# Patient Record
Sex: Male | Born: 1960
Health system: Southern US, Community
[De-identification: ages and names within clinical notes are randomized; demographics above are authoritative.]

## PROBLEM LIST (undated history)

## (undated) DIAGNOSIS — IMO0002 Reserved for concepts with insufficient information to code with codable children: Secondary | ICD-10-CM

## (undated) DIAGNOSIS — I10 Essential (primary) hypertension: Secondary | ICD-10-CM

## (undated) DIAGNOSIS — T7840XA Allergy, unspecified, initial encounter: Secondary | ICD-10-CM

## (undated) DIAGNOSIS — M1990 Unspecified osteoarthritis, unspecified site: Secondary | ICD-10-CM

## (undated) DIAGNOSIS — K219 Gastro-esophageal reflux disease without esophagitis: Secondary | ICD-10-CM

## (undated) HISTORY — PX: OTHER SURGICAL HISTORY: SHX169

## (undated) HISTORY — PX: JOINT REPLACEMENT: SHX530

## (undated) HISTORY — PX: CHOLECYSTECTOMY: SHX55

## (undated) HISTORY — PX: SHOULDER SURGERY: SHX246

## (undated) HISTORY — DX: Reserved for concepts with insufficient information to code with codable children: IMO0002

## (undated) HISTORY — DX: Unspecified osteoarthritis, unspecified site: M19.90

## (undated) HISTORY — DX: Essential (primary) hypertension: I10

## (undated) HISTORY — PX: HAND SURGERY: SHX662

## (undated) HISTORY — DX: Gastro-esophageal reflux disease without esophagitis: K21.9

## (undated) HISTORY — DX: Allergy, unspecified, initial encounter: T78.40XA

---

## 1986-01-04 HISTORY — PX: ANKLE FRACTURE SURGERY: SHX122

## 2001-06-02 ENCOUNTER — Ambulatory Visit (HOSPITAL_COMMUNITY): Admission: RE | Admit: 2001-06-02 | Discharge: 2001-06-02 | Payer: Self-pay | Admitting: Family Medicine

## 2001-06-02 ENCOUNTER — Encounter: Payer: Self-pay | Admitting: Family Medicine

## 2002-10-30 ENCOUNTER — Ambulatory Visit (HOSPITAL_COMMUNITY): Admission: RE | Admit: 2002-10-30 | Discharge: 2002-10-30 | Payer: Self-pay | Admitting: Family Medicine

## 2002-10-31 ENCOUNTER — Ambulatory Visit (HOSPITAL_COMMUNITY): Admission: RE | Admit: 2002-10-31 | Discharge: 2002-10-31 | Payer: Self-pay | Admitting: Family Medicine

## 2006-05-19 ENCOUNTER — Ambulatory Visit (HOSPITAL_COMMUNITY): Admission: RE | Admit: 2006-05-19 | Discharge: 2006-05-19 | Payer: Self-pay | Admitting: Family Medicine

## 2006-07-11 ENCOUNTER — Encounter: Admission: RE | Admit: 2006-07-11 | Discharge: 2006-07-28 | Payer: Self-pay | Admitting: Neurosurgery

## 2007-03-10 ENCOUNTER — Ambulatory Visit (HOSPITAL_COMMUNITY): Admission: RE | Admit: 2007-03-10 | Discharge: 2007-03-10 | Payer: Self-pay | Admitting: Family Medicine

## 2009-04-10 ENCOUNTER — Encounter (INDEPENDENT_AMBULATORY_CARE_PROVIDER_SITE_OTHER): Payer: Self-pay | Admitting: Emergency Medicine

## 2009-04-10 ENCOUNTER — Ambulatory Visit: Payer: Self-pay | Admitting: Cardiology

## 2009-04-10 ENCOUNTER — Observation Stay (HOSPITAL_COMMUNITY): Admission: EM | Admit: 2009-04-10 | Discharge: 2009-04-10 | Payer: Self-pay | Admitting: Emergency Medicine

## 2010-03-25 LAB — BASIC METABOLIC PANEL
BUN: 16 mg/dL (ref 6–23)
CO2: 25 mEq/L (ref 19–32)
Calcium: 8.4 mg/dL (ref 8.4–10.5)
Chloride: 109 mEq/L (ref 96–112)
Creatinine, Ser: 1.17 mg/dL (ref 0.4–1.5)
GFR calc Af Amer: >60 mL/min (ref >60)
GFR calc non Af Amer: >60 mL/min (ref >60)
Glucose, Bld: 111 mg/dL — ABNORMAL HIGH (ref 70–99)
Potassium: 3.6 mEq/L (ref 3.5–5.1)
Sodium: 137 mEq/L (ref 135–145)

## 2010-03-25 LAB — POCT CARDIAC MARKERS
CKMB, poc: <1 ng/mL — ABNORMAL LOW (ref 1.0–8.0)
CKMB, poc: <1 ng/mL — ABNORMAL LOW (ref 1.0–8.0)
CKMB, poc: <1 ng/mL — ABNORMAL LOW (ref 1.0–8.0)
Myoglobin, poc: 105 ng/mL (ref 12–200)
Myoglobin, poc: 63.6 ng/mL (ref 12–200)
Myoglobin, poc: 95.1 ng/mL (ref 12–200)
Troponin i, poc: <0.05 ng/mL (ref 0.00–0.09)
Troponin i, poc: <0.05 ng/mL (ref 0.00–0.09)
Troponin i, poc: <0.05 ng/mL (ref 0.00–0.09)

## 2010-03-25 LAB — DIFFERENTIAL
Basophils Absolute: 0 10*3/uL (ref 0.0–0.1)
Basophils Relative: 0 % (ref 0–1)
Eosinophils Absolute: 0.3 10*3/uL (ref 0.0–0.7)
Eosinophils Relative: 3 % (ref 0–5)
Lymphocytes Relative: 13 % (ref 12–46)
Lymphs Abs: 1.3 10*3/uL (ref 0.7–4.0)
Monocytes Absolute: 0.8 10*3/uL (ref 0.1–1.0)
Monocytes Relative: 8 % (ref 3–12)
Neutro Abs: 7.6 10*3/uL (ref 1.7–7.7)
Neutrophils Relative %: 76 % (ref 43–77)

## 2010-03-25 LAB — CBC
HCT: 39.1 % (ref 39.0–52.0)
Hemoglobin: 13.2 g/dL (ref 13.0–17.0)
MCHC: 33.8 g/dL (ref 30.0–36.0)
MCV: 95.7 fL (ref 78.0–100.0)
Platelets: 232 10*3/uL (ref 150–400)
RBC: 4.09 MIL/uL — ABNORMAL LOW (ref 4.22–5.81)
RDW: 13.2 % (ref 11.5–15.5)
WBC: 10 10*3/uL (ref 4.0–10.5)

## 2011-09-28 ENCOUNTER — Encounter: Payer: Self-pay | Admitting: Gastroenterology

## 2011-11-10 ENCOUNTER — Other Ambulatory Visit: Payer: Self-pay | Admitting: Gastroenterology

## 2012-05-09 ENCOUNTER — Other Ambulatory Visit: Payer: Self-pay | Admitting: Nurse Practitioner

## 2012-05-09 ENCOUNTER — Other Ambulatory Visit: Payer: Self-pay | Admitting: *Deleted

## 2012-05-09 MED ORDER — EPINEPHRINE 0.3 MG/0.3ML IJ SOAJ: 0.3000 mg | Freq: Once | INTRAMUSCULAR | Status: DC

## 2013-02-06 ENCOUNTER — Encounter: Payer: Self-pay | Admitting: Family Medicine

## 2013-02-06 ENCOUNTER — Ambulatory Visit (INDEPENDENT_AMBULATORY_CARE_PROVIDER_SITE_OTHER): Payer: Federal, State, Local not specified - PPO | Admitting: Family Medicine

## 2013-02-06 ENCOUNTER — Encounter (INDEPENDENT_AMBULATORY_CARE_PROVIDER_SITE_OTHER): Payer: Self-pay

## 2013-02-06 VITALS — BP 152/91 | HR 86 | Temp 97.8°F | Ht 70.0 in | Wt 182.2 lb

## 2013-02-06 DIAGNOSIS — Z119 Encounter for screening for infectious and parasitic diseases, unspecified: Secondary | ICD-10-CM

## 2013-02-06 DIAGNOSIS — K219 Gastro-esophageal reflux disease without esophagitis: Secondary | ICD-10-CM | POA: Insufficient documentation

## 2013-02-06 DIAGNOSIS — Z860100 Personal history of colon polyps, unspecified: Secondary | ICD-10-CM

## 2013-02-06 DIAGNOSIS — J329 Chronic sinusitis, unspecified: Secondary | ICD-10-CM | POA: Insufficient documentation

## 2013-02-06 DIAGNOSIS — I10 Essential (primary) hypertension: Secondary | ICD-10-CM | POA: Insufficient documentation

## 2013-02-06 DIAGNOSIS — IMO0002 Reserved for concepts with insufficient information to code with codable children: Secondary | ICD-10-CM | POA: Insufficient documentation

## 2013-02-06 DIAGNOSIS — Z23 Encounter for immunization: Secondary | ICD-10-CM

## 2013-02-06 DIAGNOSIS — M1990 Unspecified osteoarthritis, unspecified site: Secondary | ICD-10-CM | POA: Insufficient documentation

## 2013-02-06 DIAGNOSIS — K8020 Calculus of gallbladder without cholecystitis without obstruction: Secondary | ICD-10-CM

## 2013-02-06 DIAGNOSIS — Z13220 Encounter for screening for lipoid disorders: Secondary | ICD-10-CM

## 2013-02-06 DIAGNOSIS — M129 Arthropathy, unspecified: Secondary | ICD-10-CM

## 2013-02-06 DIAGNOSIS — Z0000 Encounter for general adult medical examination without abnormal findings: Secondary | ICD-10-CM | POA: Insufficient documentation

## 2013-02-06 MED ORDER — MELOXICAM 15 MG PO TABS: 15.0000 mg | ORAL_TABLET | Freq: Every day | ORAL | Status: DC

## 2013-02-06 MED ORDER — AMOXICILLIN-POT CLAVULANATE 875-125 MG PO TABS: 1.0000 | ORAL_TABLET | Freq: Two times a day (BID) | ORAL | Status: DC

## 2013-02-06 MED ORDER — MOMETASONE FUROATE 50 MCG/ACT NA SUSP: 2.0000 | Freq: Every day | NASAL | Status: DC

## 2013-02-06 NOTE — Patient Instructions (Addendum)
HEALTH MAINTENANCE Immunizations: Tetanus-Diphtheria Booster ZOX:WRUEA Pertusis Booster due: today Flu Shot Due: every Fall, in 2 weeks. Pneumonia Vaccine: usually at 54 years of age unless there are certain risk situations. Herpes Zoster/Shingles Vaccine due: usually at 54 years of age HPV VWU:JWJX age 38 to 15 years in males and females.  Healthy Life Habits: Exercise Goal: 5-6 days/week; start gradually(ie 30 minutes/3days per week) Nutrition: Balanced healthy meals including Vegetables and Fruits. Consider  Reading the following books: 1) Eat to Live by Dr Ottis Stain; 2) Prevent and Reverse Heart Disease by Dr Suzzette Righter.  Vitamins:okay to take a multivitamin Aspirin:hold Stop Tobacco Use:n/a Seat Belt Use:+++ recommended Sunscreen Use:+++ recommended  Recommended Screening Tests: Colon Cancer Screening: will refer Blood work: today Cholesterol Screening:today            HIV:n/a                    Hepatitis C(people born 03-1964): today   Monthly Self Testicular Exam:+++  Eye Exam: every 1 to 2 years recommended Dental Health: at least every 6 months  Others:    Living Will/Healthcare Power of Attorney: should have this in order with your personal estate planning  DASH Diet The DASH diet stands for "Dietary Approaches to Stop Hypertension." It is a healthy eating plan that has been shown to reduce high blood pressure (hypertension) in as little as 14 days, while also possibly providing other significant health benefits. These other health benefits include reducing the risk of breast cancer after menopause and reducing the risk of type 2 diabetes, heart disease, colon cancer, and stroke. Health benefits also include weight loss and slowing kidney failure in patients with chronic kidney disease.  DIET GUIDELINES  Limit salt (sodium). Your diet should contain less than 1500 mg of sodium daily.  Limit refined or processed carbohydrates. Your diet should  include mostly whole grains. Desserts and added sugars should be used sparingly.  Include small amounts of heart-healthy fats. These types of fats include nuts, oils, and tub margarine. Limit saturated and trans fats. These fats have been shown to be harmful in the body. CHOOSING FOODS  The following food groups are based on a 2000 calorie diet. See your Registered Dietitian for individual calorie needs. Grains and Grain Products (6 to 8 servings daily)  Eat More Often: Whole-wheat bread, brown rice, whole-grain or wheat pasta, quinoa, popcorn without added fat or salt (air popped).  Eat Less Often: White bread, white pasta, white rice, cornbread. Vegetables (4 to 5 servings daily)  Eat More Often: Fresh, frozen, and canned vegetables. Vegetables may be raw, steamed, roasted, or grilled with a minimal amount of fat.  Eat Less Often/Avoid: Creamed or fried vegetables. Vegetables in a cheese sauce. Fruit (4 to 5 servings daily)  Eat More Often: All fresh, canned (in natural juice), or frozen fruits. Dried fruits without added sugar. One hundred percent fruit juice ( cup [237 mL] daily).  Eat Less Often: Dried fruits with added sugar. Canned fruit in light or heavy syrup. [employer], Fish, and Poultry (2 servings or less daily. One serving is 3 to 4 oz [85-114 g]).  Eat More Often: Ninety percent or leaner ground beef, tenderloin, sirloin. Round cuts of beef, chicken breast, [place] breast. All fish. Grill, bake, or broil your meat. Nothing should be fried.  Eat Less Often/Avoid: Fatty cuts of meat, [place], or chicken leg, thigh, or wing. Fried cuts of meat or fish. Dairy (2 to 3 servings)  Eat More Often: Low-fat or fat-free milk, low-fat plain or light yogurt, reduced-fat or part-skim cheese.  Eat Less Often/Avoid: Milk (whole, 2%).Whole milk yogurt. Full-fat cheeses. Nuts, Seeds, and Legumes (4 to 5 servings per week)  Eat More Often: All without added salt.  Eat Less Often/Avoid:  Salted nuts and seeds, canned beans with added salt. Fats and Sweets (limited)  Eat More Often: Vegetable oils, tub margarines without trans fats, sugar-free gelatin. Mayonnaise and salad dressings.  Eat Less Often/Avoid: Coconut oils, palm oils, butter, stick margarine, cream, half and half, cookies, candy, pie. FOR MORE INFORMATION The Dash Diet Eating Plan: www.dashdiet.org Document Released: 12/10/2010 Document Revised: 03/15/2011 Document Reviewed: 12/10/2010 [facility] Patient Information 2014 [place], [place]. Tetanus, Diphtheria, Pertussis (Tdap) Vaccine What You Need to Know WHY GET VACCINATED? Tetanus, diphtheria and pertussis can be very serious diseases, even for adolescents and adults. Tdap vaccine can protect [place] from these diseases. TETANUS (Lockjaw) causes painful muscle tightening and stiffness, usually all over the body.  It can lead to tightening of muscles in the head and neck so you can't open your mouth, swallow, or sometimes even breathe. Tetanus kills about 1 out of 5 people who are infected. DIPHTHERIA can cause a thick coating to form in the back of the throat.  It can lead to breathing problems, paralysis, heart failure, and death. PERTUSSIS (Whooping Cough) causes severe coughing spells, which can cause difficulty breathing, vomiting and disturbed sleep.  It can also lead to weight loss, incontinence, and rib fractures. Up to 2 in 100 adolescents and 5 in 100 adults with pertussis are hospitalized or have complications, which could include pneumonia and death. These diseases are caused by bacteria. Diphtheria and pertussis are spread from person to person through coughing or sneezing. Tetanus enters the body through cuts, scratches, or wounds. Before vaccines, the [place] saw as many as 200,000 cases a year of diphtheria and pertussis, and hundreds of cases of tetanus. Since vaccination began, tetanus and diphtheria have dropped by about 99% and pertussis by  about 80%. TDAP VACCINE Tdap vaccine can protect adolescents and adults from tetanus, diphtheria, and pertussis. One dose of Tdap is routinely given at age 22 or 83. People who did not get Tdap at that age should get it as soon as possible. Tdap is especially important for health care professionals and anyone having close contact with a baby younger than 12 months. Pregnant women should get a dose of Tdap during every pregnancy, to protect the newborn from pertussis. Infants are most at risk for severe, life-threatening complications from pertussis. A similar vaccine, called Td, protects from tetanus and diphtheria, but not pertussis. A Td booster should be given every 10 years. Tdap may be given as one of these boosters if you have not already gotten a dose. Tdap may also be given after a severe cut or burn to prevent tetanus infection. Your doctor can give you more information. Tdap may safely be given at the same time as other vaccines. SOME PEOPLE SHOULD NOT GET THIS VACCINE  If you ever had a life-threatening allergic reaction after a dose of any tetanus, diphtheria, or pertussis containing vaccine, OR if you have a severe allergy to any part of this vaccine, you should not get Tdap. Tell your doctor if you have any severe allergies.  If you had a coma, or long or multiple seizures within 7 days after a childhood dose of DTP or DTaP, you should not get Tdap, unless a cause other than  the vaccine was found. You can still get Td.  Talk to your doctor if you:  have epilepsy or another nervous system problem,  had severe pain or swelling after any vaccine containing diphtheria, tetanus or pertussis,  ever had Guillain-Barr Syndrome (GBS),  aren't feeling well on the day the shot is scheduled. RISKS OF A VACCINE REACTION With any medicine, including vaccines, there is a chance of side effects. These are usually mild and go away on their own, but serious reactions are also possible. Brief  fainting spells can follow a vaccination, leading to injuries from falling. Sitting or lying down for about 15 minutes can help prevent these. Tell your doctor if you feel dizzy or light-headed, or have vision changes or ringing in the ears. Mild problems following Tdap (Did not interfere with activities)  Pain where the shot was given (about 3 in 4 adolescents or 2 in 3 adults)  Redness or swelling where the shot was given (about 1 person in 5)  Mild fever of at least 100.21F (up to about 1 in 25 adolescents or 1 in 100 adults)  Headache (about 3 or 4 people in 10)  Tiredness (about 1 person in 3 or 4)  Nausea, vomiting, diarrhea, stomach ache (up to 1 in 4 adolescents or 1 in 10 adults)  Chills, body aches, sore joints, rash, swollen glands (uncommon) Moderate problems following Tdap (Interfered with activities, but did not require medical attention)  Pain where the shot was given (about 1 in 5 adolescents or 1 in 100 adults)  Redness or swelling where the shot was given (up to about 1 in 16 adolescents or 1 in 25 adults)  Fever over 102F (about 1 in 100 adolescents or 1 in 250 adults)  Headache (about 3 in 20 adolescents or 1 in 10 adults)  Nausea, vomiting, diarrhea, stomach ache (up to 1 or 3 people in 100)  Swelling of the entire arm where the shot was given (up to about 3 in 100). Severe problems following Tdap (Unable to perform usual activities, required medical attention)  Swelling, severe pain, bleeding and redness in the arm where the shot was given (rare). A severe allergic reaction could occur after any vaccine (estimated less than 1 in a million doses). WHAT IF THERE IS A SERIOUS REACTION? What should I look for?  Look for anything that concerns you, such as signs of a severe allergic reaction, very high fever, or behavior changes. Signs of a severe allergic reaction can include hives, swelling of the face and throat, difficulty breathing, a fast heartbeat,  dizziness, and weakness. These would start a few minutes to a few hours after the vaccination. What should I do?  If you think it is a severe allergic reaction or other emergency that can't wait, call 9-1-1 or get the person to the nearest hospital. Otherwise, call your doctor.  Afterward, the reaction should be reported to the "Vaccine Adverse Event Reporting System" (VAERS). Your doctor might file this report, or you can do it yourself through the VAERS web site at www.vaers.LAgents.no, or by calling 1-(339)199-3511. VAERS is only for reporting reactions. They do not give medical advice.  THE NATIONAL VACCINE INJURY COMPENSATION PROGRAM The National Vaccine Injury Compensation Program (VICP) is a federal program that was created to compensate people who may have been injured by certain vaccines. Persons who believe they may have been injured by a vaccine can learn about the program and about filing a claim by calling 1-850-146-5384 or visiting  the VICP website at SpiritualWord.atwww.hrsa.gov/vaccinecompensation. HOW CAN I LEARN MORE?  Ask your doctor.  Call your local or state health department.  Contact the Centers for Disease Control and Prevention (CDC):  Call 320-336-22761-601-590-9849 or visit CDC's website at PicCapture.uywww.cdc.gov/vaccines. CDC Tdap Vaccine VIS (05/13/11) Document Released: 06/22/2011 Document Revised: 04/17/2012 Document Reviewed: 04/12/2012 [facility] Patient Information 2014 [place].

## 2013-02-06 NOTE — Progress Notes (Signed)
Patient ID: Scott James, male   DOB: [birth year], 53 y.o.   MRN: [id number] SUBJECTIVE: CC: Chief Complaint  Patient presents with  . Annual Exam    [facility] for chest pain states had complete workiup yest states has gallstones and will need surgeon  . Sinusitis    thinks has sinus infection     HPI: Here for annual physical. Last night had chest pain and abdominal pain. Evaluated in [place]: found to have Gallstones. Had stress test several years ago: was fine. Has had elevated BP in the past. Lisinopril 5 mg and his BP bottomed out and had to come off of it. needs a colonoscopy for screening. No pain now.   Past Medical History  Diagnosis Date  . Bulging disc     L4-5  . Hypertension   . GERD (gastroesophageal reflux disease)   . Arthritis     rt ankle   Past Surgical History  Procedure Laterality Date  . Tonsillectomy    . Joint replacement      rt knee arthroscopy  . Ankle fracture surgery Right     shatterred talleous    History   Social History  . Marital Status: Married    Spouse Name: N/A    Number of Children: N/A  . Years of Education: N/A   Occupational History  . Not on file.   Social History Main Topics  . Smoking status: Former Smoker    Types: Cigarettes    Quit date: 02/07/2007  . Smokeless tobacco: Not on file  . Alcohol Use: Not on file  . Drug Use: Not on file  . Sexual Activity: Not on file   Other Topics Concern  . Not on file   Social History Narrative  . No narrative on file   Family History  Problem Relation Age of Onset  . Hypertension Mother    Current Outpatient Prescriptions on File Prior to Visit  Medication Sig Dispense Refill  . EPINEPHrine (EPIPEN) 0.3 mg/0.3 mL DEVI Inject 0.3 mLs (0.3 mg total) into the muscle once.  2 Device  1   No current facility-administered medications on file prior to visit.   No Known Allergies Immunization History  Administered Date(s) Administered  . Tdap 02/06/2013    Prior to Admission medications   Medication Sig Start Date End Date Taking? Authorizing Provider  aspirin 81 MG tablet Take 81 mg by mouth daily.   Yes Historical Provider, MD  EPINEPHrine (EPIPEN) 0.3 mg/0.3 mL DEVI Inject 0.3 mLs (0.3 mg total) into the muscle once. 05/09/12  Yes Mary-Margaret Daphine Deutscher, FNP  pantoprazole (PROTONIX) 40 MG tablet Take 40 mg by mouth daily.   Yes Historical Provider, MD     ROS: As above in the HPI. All other systems are stable or negative.  OBJECTIVE: APPEARANCE:  Patient in no acute distress.The patient appeared well nourished and normally developed. Acyanotic. Waist: VITAL SIGNS:BP 152/91  Pulse 86  Temp(Src) 97.8 F (36.6 C) (Oral)  Ht 5\' 10"  (1.778 m)  Wt 182 lb 3.2 oz (82.645 kg)  BMI 26.14 kg/m2 WM  Recheck 130/85 LA  SKIN: warm and  Dry without overt rashes, tattoos and scars  HEAD and Neck: without JVD, Head and scalp: normal Eyes:No scleral icterus. Fundi normal, eye movements normal. Ears: Auricle normal, canal normal, Tympanic membranes normal, insufflation normal. Nose: normal Throat: normal Neck & thyroid: normal  CHEST & LUNGS: Chest wall: normal Lungs: Clear  CVS: Reveals the PMI to be  normally located. Regular rhythm, First and Second Heart sounds are normal,  absence of murmurs, rubs or gallops. Peripheral vasculature: Radial pulses: normal Dorsal pedis pulses: normal Posterior pulses: normal  ABDOMEN:  Appearance: normal Benign, no organomegaly, no masses, no Abdominal Aortic enlargement. No Guarding , no rebound. No Bruits. Bowel sounds: normal  RECTAL: N/A GU: N/A  EXTREMETIES: nonedematous.  MUSCULOSKELETAL:  Spine: normal Joints: intact  NEUROLOGIC: oriented to time,place and person; nonfocal. Strength is normal Sensory is normal Reflexes are normal Cranial Nerves are normal.  ASSESSMENT: Annual physical exam  GERD (gastroesophageal reflux disease)  Bulging  disc  Hypertension  Arthritis  Sinusitis nasal - Plan: amoxicillin-clavulanate (AUGMENTIN) 875-125 MG per tablet, mometasone (NASONEX) 50 MCG/ACT nasal spray, meloxicam (MOBIC) 15 MG tablet  Gallstones - Plan: Ambulatory referral to General Surgery, Amylase, Hepatic function panel, Lipase  Screening cholesterol level - Plan: Lipid panel  Screening examination for infectious disease - Plan: Hepatitis C antibody  Personal history of colonic polyps - Plan: Ambulatory referral to Gastroenterology  Need for Tdap vaccination - Plan: Tdap vaccine greater than or equal to 7yo IM  PLAN:      HEALTH MAINTENANCE Immunizations: Tetanus-Diphtheria Booster ZOX:WRUEAdue:today Pertusis Booster due: today Flu Shot Due: every Fall, in 2 weeks. Pneumonia Vaccine: usually at 54 years of age unless there are certain risk situations. Herpes Zoster/Shingles Vaccine due: usually at 53 years of age HPV VWU:JWJXdue:from age 49 to 3426 years in males and females.  Healthy Life Habits: Exercise Goal: 5-6 days/week; start gradually(ie 30 minutes/3days per week) Nutrition: Balanced healthy meals including Vegetables and Fruits. Consider  Reading the following books: 1) Eat to Live by Dr Ottis StainJoel Furhman; 2) Prevent and Reverse Heart Disease by Dr Suzzette Righteraldwell Esselstyn.  Vitamins:okay to take a multivitamin Aspirin:hold Stop Tobacco Use:n/a Seat Belt Use:+++ recommended Sunscreen Use:+++ recommended  Recommended Screening Tests: Colon Cancer Screening: will refer Blood work: today Cholesterol Screening:today            HIV:n/a                    Hepatitis C(people born 441945-1966): today   Monthly Self Testicular Exam:+++  Eye Exam: every 1 to 2 years recommended Dental Health: at least every 6 months  Others:    Living Will/Healthcare Power of Attorney: should have this in order with your personal estate planning   Handout on DASH Diet in the AVS.  Orders Placed This Encounter  Procedures  . Tdap vaccine greater  than or equal to 7yo IM  . Hepatitis C antibody  . Lipid panel  . Amylase  . Hepatic function panel  . Lipase  . Ambulatory referral to General Surgery    Referral Priority:  Urgent    Referral Type:  Surgical    Referral Reason:  Specialty Services Required    Requested Specialty:  General Surgery    Number of Visits Requested:  1  . Ambulatory referral to Gastroenterology    Referral Priority:  Routine    Referral Type:  Consultation    Referral Reason:  Specialty Services Required    Requested Specialty:  Gastroenterology    Number of Visits Requested:  1   Meds ordered this encounter  Medications  . aspirin 81 MG tablet    Sig: Take 81 mg by mouth daily.  . pantoprazole (PROTONIX) 40 MG tablet    Sig: Take 40 mg by mouth daily.  Marland Kitchen. amoxicillin-clavulanate (AUGMENTIN) 875-125 MG per tablet    Sig: Take  1 tablet by mouth 2 (two) times daily.    Dispense:  20 tablet    Refill:  0  . mometasone (NASONEX) 50 MCG/ACT nasal spray    Sig: Place 2 sprays into the nose daily.    Dispense:  17 g    Refill:  5  . meloxicam (MOBIC) 15 MG tablet    Sig: Take 1 tablet (15 mg total) by mouth daily.    Dispense:  30 tablet    Refill:  2   There are no discontinued medications. Return in about 2 months (around 04/06/2013) for Recheck medical problems.  Devontay Celaya P. [place] Charon, M.D.

## 2013-02-07 LAB — LIPID PANEL
Chol/HDL Ratio: 3.5 ratio units (ref 0.0–5.0)
Cholesterol, Total: 178 mg/dL (ref 100–199)
HDL: 51 mg/dL (ref >39)
LDL Calculated: 99 mg/dL (ref 0–99)
Triglycerides: 139 mg/dL (ref 0–149)
VLDL Cholesterol Cal: 28 mg/dL (ref 5–40)

## 2013-02-07 LAB — HEPATIC FUNCTION PANEL
ALT: 223 IU/L — ABNORMAL HIGH (ref 0–44)
AST: 94 IU/L — ABNORMAL HIGH (ref 0–40)
Albumin: 4.6 g/dL (ref 3.5–5.5)
Alkaline Phosphatase: 141 IU/L — ABNORMAL HIGH (ref 39–117)
Bilirubin, Direct: 0.15 mg/dL (ref 0.00–0.40)
Total Bilirubin: 0.5 mg/dL (ref 0.0–1.2)
Total Protein: 6.9 g/dL (ref 6.0–8.5)

## 2013-02-07 LAB — HEPATITIS C ANTIBODY: Hep C Virus Ab: <0.1 s/co ratio (ref 0.0–0.9)

## 2013-02-07 LAB — AMYLASE: Amylase: 64 U/L (ref 31–124)

## 2013-02-07 LAB — LIPASE: Lipase: 29 U/L (ref 0–59)

## 2013-02-07 NOTE — Progress Notes (Signed)
Quick Note:  Call Patient Labs that are abnormal: Liver enzymes still a bit elevated.  The rest are at goal  Recommendations: No changes. See the surgeon in regards to the gallstones.   ______

## 2013-02-08 ENCOUNTER — Telehealth: Payer: Self-pay | Admitting: *Deleted

## 2013-02-08 NOTE — Telephone Encounter (Signed)
Message copied by Almeta MonasSTONE, Gloriana Piltz M on Thu Feb 08, 2013  3:22 PM ------      Message from: Ileana LaddWONG, FRANCIS P      Created: Wed Feb 07, 2013  8:33 PM       Call Patient      Labs that are abnormal:      Liver enzymes still a bit elevated.            The rest are at goal            Recommendations:      No changes.      See the surgeon in regards to the gallstones.             ------

## 2013-02-13 ENCOUNTER — Encounter (INDEPENDENT_AMBULATORY_CARE_PROVIDER_SITE_OTHER): Payer: Self-pay | Admitting: Surgery

## 2013-02-13 ENCOUNTER — Ambulatory Visit (INDEPENDENT_AMBULATORY_CARE_PROVIDER_SITE_OTHER): Payer: Federal, State, Local not specified - PPO | Admitting: Surgery

## 2013-02-13 VITALS — BP 124/83 | HR 77 | Temp 98.6°F | Resp 18 | Ht 70.5 in | Wt 186.6 lb

## 2013-02-13 DIAGNOSIS — K8010 Calculus of gallbladder with chronic cholecystitis without obstruction: Secondary | ICD-10-CM

## 2013-02-13 NOTE — Progress Notes (Signed)
Patient ID: Scott James, male   DOB: [birth year], 53 y.o.   MRN: [id number]  Chief Complaint  Patient presents with  . Abdominal Pain    gallstones    HPI Scott James is a 53 y.o. male.  Referred by Dr. Modesto Charon for evaluation of gallbladder disease  Abdominal Pain Associated symptoms: nausea   Associated symptoms: no chest pain, no chills, no constipation, no cough, no diarrhea, no fever, no hematuria, no sore throat and no vomiting    This is a 53 year old male that presents with a three-week history of intermittent epigastric pain associated with some mild nausea and abdominal bloating. He has had 3 severe episodes. He was ruled out for cardiac disease. Ultrasound showed multiple gallstones but no sign of cholecystitis. His liver function tests have been mildly abnormal but bilirubin was normal.  He presents now for surgical evaluation Past Medical History  Diagnosis Date  . Bulging disc     L4-5  . Hypertension   . GERD (gastroesophageal reflux disease)   . Arthritis     rt ankle    Past Surgical History  Procedure Laterality Date  . Tonsillectomy    . Joint replacement      rt knee arthroscopy  . Ankle fracture surgery Right     shatterred talleous   . Hand surgery      Family History  Problem Relation Age of Onset  . Hypertension Mother     Social History History  Substance Use Topics  . Smoking status: Former Smoker    Types: Cigarettes    Quit date: 02/07/2007  . Smokeless tobacco: Not on file  . Alcohol Use: Yes     Comment: rarely    No Known Allergies  Current Outpatient Prescriptions  Medication Sig Dispense Refill  . amoxicillin-clavulanate (AUGMENTIN) 875-125 MG per tablet Take 1 tablet by mouth 2 (two) times daily.  20 tablet  0  . aspirin 81 MG tablet Take 81 mg by mouth daily.      Marland Kitchen EPINEPHrine (EPIPEN) 0.3 mg/0.3 mL DEVI Inject 0.3 mLs (0.3 mg total) into the muscle once.  2 Device  1  . meloxicam (MOBIC) 15 MG tablet Take 1 tablet (15 mg  total) by mouth daily.  30 tablet  2  . mometasone (NASONEX) 50 MCG/ACT nasal spray Place 2 sprays into the nose daily.  17 g  5  . pantoprazole (PROTONIX) 40 MG tablet Take 40 mg by mouth daily.       No current facility-administered medications for this visit.    Review of Systems Review of Systems  Constitutional: Negative for fever, chills and unexpected weight change.  HENT: Negative for congestion, hearing loss, sore throat, trouble swallowing and voice change.   Eyes: Negative for visual disturbance.  Respiratory: Negative for cough and wheezing.   Cardiovascular: Negative for chest pain, palpitations and leg swelling.  Gastrointestinal: Positive for nausea, abdominal pain and abdominal distention. Negative for vomiting, diarrhea, constipation, blood in stool, anal bleeding and rectal pain.  Genitourinary: Negative for hematuria and difficulty urinating.  Musculoskeletal: Negative for arthralgias.  Skin: Negative for rash and wound.  Neurological: Negative for seizures, syncope, weakness and headaches.  Hematological: Negative for adenopathy. Does not bruise/bleed easily.  Psychiatric/Behavioral: Negative for confusion.    Blood pressure 124/83, pulse 77, temperature 98.6 F (37 C), temperature source Temporal, resp. rate 18, height 5' 10.5" (1.791 m), weight 186 lb 9.6 oz (84.641 kg).  Physical Exam Physical Exam WDWN in NAD HEENT:  EOMI, sclera anicteric Neck:  No masses, no thyromegaly Lungs:  CTA bilaterally; normal respiratory effort CV:  Regular rate and rhythm; no murmurs Abd:  +bowel sounds, soft, non-tender, no masses Ext:  Well-perfused; no edema Skin:  Warm, dry; no sign of jaundice  Data Reviewed  Lab Results  Component Value Date   ALT 223* 02/06/2013   AST 94* 02/06/2013   ALKPHOS 141* 02/06/2013   BILITOT 0.5 02/06/2013   [place] chole[place]ithiasis but no sign of cholecystitis  Assessment    Chronic calculus cholecystitis     Plan     Laparoscopic cholecystectomy with intraoperative cholangiogram. The surgical procedure has been discussed with the patient.  Potential risks, benefits, alternative treatments, and expected outcomes have been explained.  All of the patient's questions at this time have been answered.  The likelihood of reaching the patient's treatment goal is good.  The patient understand the proposed surgical procedure and wishes to proceed.         Kion Huntsberry K. 02/13/2013, 4:06 PM

## 2013-02-16 ENCOUNTER — Encounter: Payer: Self-pay | Admitting: Gastroenterology

## 2013-02-27 ENCOUNTER — Ambulatory Visit (INDEPENDENT_AMBULATORY_CARE_PROVIDER_SITE_OTHER): Payer: Federal, State, Local not specified - PPO | Admitting: General Surgery

## 2013-03-06 ENCOUNTER — Other Ambulatory Visit (INDEPENDENT_AMBULATORY_CARE_PROVIDER_SITE_OTHER): Payer: Self-pay | Admitting: *Deleted

## 2013-03-06 ENCOUNTER — Other Ambulatory Visit (INDEPENDENT_AMBULATORY_CARE_PROVIDER_SITE_OTHER): Payer: Self-pay | Admitting: Surgery

## 2013-03-06 DIAGNOSIS — K8010 Calculus of gallbladder with chronic cholecystitis without obstruction: Secondary | ICD-10-CM

## 2013-03-06 MED ORDER — OXYCODONE-ACETAMINOPHEN 5-325 MG PO TABS: 1.0000 | ORAL_TABLET | ORAL | Status: DC | PRN

## 2013-03-09 ENCOUNTER — Encounter: Payer: Self-pay | Admitting: Gastroenterology

## 2013-03-30 ENCOUNTER — Encounter (INDEPENDENT_AMBULATORY_CARE_PROVIDER_SITE_OTHER): Payer: Self-pay | Admitting: Surgery

## 2013-03-30 ENCOUNTER — Ambulatory Visit (INDEPENDENT_AMBULATORY_CARE_PROVIDER_SITE_OTHER): Payer: Federal, State, Local not specified - PPO | Admitting: Surgery

## 2013-03-30 VITALS — BP 148/96 | HR 80 | Temp 97.6°F | Resp 14 | Ht 70.0 in | Wt 183.8 lb

## 2013-03-30 DIAGNOSIS — K8010 Calculus of gallbladder with chronic cholecystitis without obstruction: Secondary | ICD-10-CM

## 2013-03-30 NOTE — Progress Notes (Signed)
Status post laparoscopic cholecystectomy with cholangiogram on 03/06/13. He still well. No issues with his bowel movements except for some mild constipation. His incisions are well healed. He still has some soreness around his umbilicus. No sign of hernia. Appetite is good. He may resume full activity. Followup as needed. He may have a colonoscopy as indicated.  Wilmon ArmsMatthew K. Corliss Skainssuei, MD, [facility]  General/ Trauma Surgery  03/30/2013 9:42 AM

## 2013-05-25 ENCOUNTER — Other Ambulatory Visit: Payer: Self-pay | Admitting: Nurse Practitioner

## 2013-07-30 ENCOUNTER — Encounter: Payer: Self-pay | Admitting: Family Medicine

## 2013-07-30 ENCOUNTER — Telehealth: Payer: Self-pay | Admitting: Family Medicine

## 2013-07-30 ENCOUNTER — Ambulatory Visit (INDEPENDENT_AMBULATORY_CARE_PROVIDER_SITE_OTHER): Payer: Federal, State, Local not specified - PPO | Admitting: Family Medicine

## 2013-07-30 VITALS — BP 155/94 | HR 77 | Temp 97.9°F | Ht 70.0 in | Wt 186.0 lb

## 2013-07-30 DIAGNOSIS — L989 Disorder of the skin and subcutaneous tissue, unspecified: Secondary | ICD-10-CM

## 2013-07-30 DIAGNOSIS — G44221 Chronic tension-type headache, intractable: Secondary | ICD-10-CM

## 2013-07-30 DIAGNOSIS — G44229 Chronic tension-type headache, not intractable: Secondary | ICD-10-CM

## 2013-07-30 MED ORDER — CYCLOBENZAPRINE HCL 10 MG PO TABS: 10.0000 mg | ORAL_TABLET | Freq: Three times a day (TID) | ORAL | Status: DC | PRN

## 2013-07-30 NOTE — Telephone Encounter (Signed)
Appt given today per patients request 

## 2013-07-30 NOTE — Progress Notes (Signed)
   Subjective:    Patient ID: Dina RichStephen Keeven, male    DOB: 04/11/1960, 53 y.o.   MRN: [id number]  HPI This 53 y.o. male presents for evaluation of chronic headaches and skin lesion on right forehead.   Review of Systems No chest pain, SOB, HA, dizziness, vision change, N/V, diarrhea, constipation, dysuria, urinary urgency or frequency, myalgias, arthralgias or rash.     Objective:   Physical Exam Vital signs noted  Well developed well nourished male.  HEENT - Head atraumatic Normocephalic                Eyes - PERRLA, Conjuctiva - clear Sclera- Clear EOMI                Ears - EAC's Wnl TM's Wnl Gross Hearing WNL                Throat - oropharanx wnl Respiratory - Lungs CTA bilateral Cardiac - RRR S1 and S2 without murmur GI - Abdomen soft Nontender and bowel sounds active x 4 Skin - Oval raised skin lesion over right temporal area       Assessment & Plan:  Chronic tension-type headache, intractable - Plan: cyclobenzaprine (FLEXERIL) 10 MG tablet  Skin lesion - Plan: Ambulatory referral to Dermatology  Deatra CanterWilliam J Oxford FNP

## 2013-09-25 ENCOUNTER — Ambulatory Visit (INDEPENDENT_AMBULATORY_CARE_PROVIDER_SITE_OTHER): Payer: Federal, State, Local not specified - PPO | Admitting: Family Medicine

## 2013-09-25 ENCOUNTER — Encounter: Payer: Self-pay | Admitting: Family Medicine

## 2013-09-25 VITALS — BP 146/88 | HR 79 | Temp 98.2°F | Ht 70.0 in | Wt 186.0 lb

## 2013-09-25 DIAGNOSIS — W57XXXA Bitten or stung by nonvenomous insect and other nonvenomous arthropods, initial encounter: Secondary | ICD-10-CM

## 2013-09-25 DIAGNOSIS — T148 Other injury of unspecified body region: Secondary | ICD-10-CM

## 2013-09-25 MED ORDER — DOXYCYCLINE HYCLATE 100 MG PO TABS: 100.0000 mg | ORAL_TABLET | Freq: Two times a day (BID) | ORAL | Status: DC

## 2013-09-25 NOTE — Progress Notes (Signed)
   Subjective:    Patient ID: Scott James, male    DOB: 1960-11-20, 53 y.o.   MRN: [id number]  HPI This 54 y.o. male presents for evaluation of rash on left leg after being bitten by a tick.   Review of Systems No chest pain, SOB, HA, dizziness, vision change, N/V, diarrhea, constipation, dysuria, urinary urgency or frequency, myalgias, arthralgias or rash.     Objective:   Physical Exam Vital signs noted  Well developed well nourished male.  HEENT - Head atraumatic Normocephalic Respiratory - Lungs CTA bilateral Cardiac - RRR S1 and S2 without murmur Skin - rash on left leg       Assessment & Plan:  Tick bite - Plan: Lyme Ab/Western Blot Reflex, Rocky mtn spotted fvr abs pnl(IgG+IgM) Doxycycline  one po bid x 10 days #20 Deatra Canter FNP

## 2013-09-27 LAB — RMSF, IGG, IFA: RMSF, IGG, IFA: 1:64 {titer} — ABNORMAL HIGH

## 2013-09-27 LAB — ROCKY MTN SPOTTED FVR ABS PNL(IGG+IGM)
RMSF IgG: UNDETERMINED
RMSF IgM: 0.17 index (ref 0.00–0.89)

## 2013-09-27 LAB — LYME AB/WESTERN BLOT REFLEX
LYME DISEASE AB, QUANT, IGM: <0.8 index (ref 0.00–0.79)
Lyme IgG/IgM Ab: <0.91 {ISR} (ref 0.00–0.90)

## 2013-09-28 ENCOUNTER — Telehealth: Payer: Self-pay | Admitting: *Deleted

## 2013-09-28 NOTE — Telephone Encounter (Signed)
Message copied by Almeta Monas on Fri Sep 28, 2013  2:06 PM ------      Message from: Deatra Canter      Created: Fri Sep 28, 2013  8:47 AM       lymes titre is negative and RMSF is equivocal and doxycycline will tx any equivocal or positive RMSF titre.  Continue doxycycline ------

## 2013-09-28 NOTE — Telephone Encounter (Signed)
Aware. 

## 2013-10-09 ENCOUNTER — Telehealth: Payer: Self-pay | Admitting: *Deleted

## 2013-10-09 MED ORDER — DOXYCYCLINE HYCLATE 100 MG PO TABS: 100.0000 mg | ORAL_TABLET | Freq: Two times a day (BID) | ORAL | Status: DC

## 2013-10-09 NOTE — Telephone Encounter (Signed)
[facility] called regarding positive RMSF titer. Patient was treated with course of doxycycline for 10 days. He saw Ander SladeBill Oxford, FNP. Recommended is 14 days.  Since he finished it about a week ago should we restart the full 14 day course instead of adding 4 additional days?

## 2013-10-09 NOTE — Telephone Encounter (Signed)
Medication sent to pharmacy.  Patient aware.  

## 2013-10-09 NOTE — Telephone Encounter (Signed)
Retreat this patient with doxycycline 100 mg twice daily for 2 weeks

## 2013-10-09 NOTE — Telephone Encounter (Signed)
Pt notified of results and recommendation Will complete 2 wk course of Doxycycline Will return to clinic if needed

## 2013-10-24 ENCOUNTER — Telehealth: Payer: Self-pay | Admitting: Family Medicine

## 2013-10-24 NOTE — Telephone Encounter (Signed)
lmtcb -kc 10/21

## 2013-10-24 NOTE — Telephone Encounter (Signed)
Appt given for tomorrow per patient request 

## 2013-10-25 ENCOUNTER — Ambulatory Visit (INDEPENDENT_AMBULATORY_CARE_PROVIDER_SITE_OTHER): Payer: Federal, State, Local not specified - PPO | Admitting: Nurse Practitioner

## 2013-10-25 ENCOUNTER — Encounter: Payer: Self-pay | Admitting: Nurse Practitioner

## 2013-10-25 ENCOUNTER — Ambulatory Visit (INDEPENDENT_AMBULATORY_CARE_PROVIDER_SITE_OTHER): Payer: Federal, State, Local not specified - PPO

## 2013-10-25 VITALS — BP 159/96 | HR 90 | Temp 97.5°F | Ht 70.0 in | Wt 184.0 lb

## 2013-10-25 DIAGNOSIS — M25561 Pain in right knee: Secondary | ICD-10-CM

## 2013-10-25 MED ORDER — PREDNISONE 20 MG PO TABS: ORAL_TABLET | ORAL | Status: DC

## 2013-10-25 MED ORDER — METHYLPREDNISOLONE ACETATE 80 MG/ML IJ SUSP
80.0000 mg | Freq: Once | INTRAMUSCULAR | Status: AC
  Administered 2013-10-25: 80 mg via INTRAMUSCULAR

## 2013-10-25 NOTE — Progress Notes (Signed)
   Subjective:    Patient ID: Scott James, male    DOB: 03/14/1960, 53 y.o.   MRN: [id number]  HPI Patient was walking at work and felt a pain in his right knee- This happened Tuesday and since then it is swollen and hurts to walk on.    Review of Systems  All other systems reviewed and are negative.      Objective:   Physical Exam  Constitutional: He is oriented to person, place, and time. He appears well-developed and well-nourished.  Cardiovascular: Normal rate and normal heart sounds.   Pulmonary/Chest: Effort normal and breath sounds normal.  Musculoskeletal:  Right knee effusion- pain on full extension Crepitus on flex and ext. All ligaments intact.  Neurological: He is alert and oriented to person, place, and time.  Skin: Skin is warm and dry.  Psychiatric: He has a normal mood and affect. His behavior is normal. Judgment and thought content normal.   BP 159/96  Pulse 90  Temp(Src) 97.5 F (36.4 C) (Oral)  Ht 5\' 10"  (1.778 m)  Wt 184 lb (83.462 kg)  BMI 26.40 kg/m2  Right knee xary- no acute findings-Preliminary reading by Paulene FloorMary Jaquay Morneault, FNP  [facility]       Assessment & Plan:   1. Right knee pain    Meds ordered this encounter  Medications  . methylPREDNISolone acetate (DEPO-MEDROL) injection 80 mg    Sig:   . predniSONE (DELTASONE) 20 MG tablet    Sig: 2 Tablets PO at the same time daily for 5 days- do not start until tomorrow    Dispense:  10 tablet    Refill:  0    Order Specific Question:  Supervising Provider    Answer:  Ernestina PennaMOORE, DONALD W [[id number]]   Rest Ice Elevate when sitting  Mary-Margaret Daphine DeutscherMartin, FNP

## 2013-10-25 NOTE — Patient Instructions (Signed)

## 2013-11-02 ENCOUNTER — Telehealth: Payer: Self-pay | Admitting: Nurse Practitioner

## 2013-11-02 DIAGNOSIS — M25561 Pain in right knee: Secondary | ICD-10-CM

## 2013-11-02 NOTE — Telephone Encounter (Signed)
Need ortho referral- who do want to see

## 2013-11-02 NOTE — Telephone Encounter (Signed)
Pt states the steroids took the swelling and fluid out of his knee. He's still c/o pain in his knee while walking and if he sits in one place for too long. He has been taking tylenol twice a day to try and alleviate the discomfort. Pt wants to know what else he can do? Please advise

## 2013-11-03 NOTE — Telephone Encounter (Signed)
[facility] and referral was ordered.

## 2013-11-23 ENCOUNTER — Ambulatory Visit (INDEPENDENT_AMBULATORY_CARE_PROVIDER_SITE_OTHER): Payer: Federal, State, Local not specified - PPO | Admitting: Family Medicine

## 2013-11-23 ENCOUNTER — Telehealth: Payer: Self-pay | Admitting: Nurse Practitioner

## 2013-11-23 VITALS — BP 145/93 | HR 96 | Temp 97.7°F | Wt 181.8 lb

## 2013-11-23 DIAGNOSIS — J069 Acute upper respiratory infection, unspecified: Secondary | ICD-10-CM

## 2013-11-23 MED ORDER — AZITHROMYCIN 250 MG PO TABS: ORAL_TABLET | ORAL | Status: DC

## 2013-11-23 MED ORDER — METHYLPREDNISOLONE ACETATE 80 MG/ML IJ SUSP
80.0000 mg | Freq: Once | INTRAMUSCULAR | Status: AC
  Administered 2013-11-23: 80 mg via INTRAMUSCULAR

## 2013-11-23 MED ORDER — BENZONATATE 100 MG PO CAPS: 100.0000 mg | ORAL_CAPSULE | Freq: Three times a day (TID) | ORAL | Status: DC | PRN

## 2013-11-23 NOTE — Progress Notes (Signed)
   Subjective:    Patient ID: Scott RichStephen Albee, male    DOB: 04/08/1960, 53 y.o.   MRN: [id number]  HPI C/o uri sx's for 3 days  Review of Systems  Constitutional: Negative for fever.  HENT: Negative for ear pain.   Eyes: Negative for discharge.  Respiratory: Negative for cough.   Cardiovascular: Negative for chest pain.  Gastrointestinal: Negative for abdominal distention.  Endocrine: Negative for polyuria.  Genitourinary: Negative for difficulty urinating.  Musculoskeletal: Negative for gait problem and neck pain.  Skin: Negative for color change and rash.  Neurological: Negative for speech difficulty and headaches.  Psychiatric/Behavioral: Negative for agitation.       Objective:    BP 145/93 mmHg  Pulse 96  Temp(Src) 97.7 F (36.5 C) (Oral)  Wt 181 lb 12.8 oz (82.464 kg) Physical Exam  Constitutional: He is oriented to person, place, and time. He appears well-developed and well-nourished.  HENT:  Head: Normocephalic and atraumatic.  Mouth/Throat: Oropharynx is clear and moist.  Eyes: Pupils are equal, round, and reactive to light.  Neck: Normal range of motion. Neck supple.  Cardiovascular: Normal rate and regular rhythm.   No murmur heard. Pulmonary/Chest: Effort normal and breath sounds normal.  Abdominal: Soft. Bowel sounds are normal. There is no tenderness.  Neurological: He is alert and oriented to person, place, and time.  Skin: Skin is warm and dry.  Psychiatric: He has a normal mood and affect.          Assessment & Plan:     ICD-9-CM ICD-10-CM   1. URI (upper respiratory infection) 465.9 J06.9 azithromycin (ZITHROMAX) 250 MG tablet     methylPREDNISolone acetate (DEPO-MEDROL) injection 80 mg     benzonatate (TESSALON PERLES) 100 MG capsule   Push po fluids, rest, tylenol and motrin otc prn as directed for fever, arthralgias, and myalgias.  Follow up prn if sx's continue or persist.  No Follow-up on file.  Deatra CanterWilliam J Oxford FNP

## 2013-11-23 NOTE — Telephone Encounter (Signed)
Appointment given for 4:45 with Annette StableBill

## 2014-01-18 ENCOUNTER — Ambulatory Visit (INDEPENDENT_AMBULATORY_CARE_PROVIDER_SITE_OTHER): Payer: Federal, State, Local not specified - PPO | Admitting: Family Medicine

## 2014-01-18 ENCOUNTER — Encounter: Payer: Self-pay | Admitting: Family Medicine

## 2014-01-18 VITALS — BP 153/95 | HR 101 | Temp 97.3°F | Ht 70.0 in | Wt 184.0 lb

## 2014-01-18 DIAGNOSIS — J206 Acute bronchitis due to rhinovirus: Secondary | ICD-10-CM

## 2014-01-18 MED ORDER — METHYLPREDNISOLONE ACETATE 80 MG/ML IJ SUSP
80.0000 mg | Freq: Once | INTRAMUSCULAR | Status: AC
  Administered 2014-01-18: 80 mg via INTRAMUSCULAR

## 2014-01-18 MED ORDER — HYDROCODONE-HOMATROPINE 5-1.5 MG/5ML PO SYRP: 5.0000 mL | ORAL_SOLUTION | Freq: Three times a day (TID) | ORAL | Status: DC | PRN

## 2014-01-18 MED ORDER — AZITHROMYCIN 250 MG PO TABS: ORAL_TABLET | ORAL | Status: DC

## 2014-01-18 MED ORDER — METHYLPREDNISOLONE ACETATE 80 MG/ML IJ SUSP: 80.0000 mg | Freq: Once | INTRAMUSCULAR | Status: DC

## 2014-01-18 MED ORDER — IPRATROPIUM-ALBUTEROL 0.5-2.5 (3) MG/3ML IN SOLN
3.0000 mL | Freq: Once | RESPIRATORY_TRACT | Status: AC
  Administered 2014-01-18: 3 mL via RESPIRATORY_TRACT

## 2014-01-18 MED ORDER — LEVALBUTEROL HCL 1.25 MG/0.5ML IN NEBU: 1.2500 mg | INHALATION_SOLUTION | Freq: Once | RESPIRATORY_TRACT | Status: DC

## 2014-01-18 NOTE — Progress Notes (Signed)
   Subjective:    Patient ID: Scott James, male    DOB: 04/18/1960, 54 y.o.   MRN: [id number]  HPI Patient c/o persistent cough and uri sx's.    Review of Systems  Constitutional: Negative for fever.  HENT: Negative for ear pain.   Eyes: Negative for discharge.  Respiratory: Negative for cough.   Cardiovascular: Negative for chest pain.  Gastrointestinal: Negative for abdominal distention.  Endocrine: Negative for polyuria.  Genitourinary: Negative for difficulty urinating.  Musculoskeletal: Negative for gait problem and neck pain.  Skin: Negative for color change and rash.  Neurological: Negative for speech difficulty and headaches.  Psychiatric/Behavioral: Negative for agitation.       Objective:    BP 153/95 mmHg  Pulse 101  Temp(Src) 97.3 F (36.3 C) (Oral)  Ht 5\' 10"  (1.778 m)  Wt 184 lb (83.462 kg)  BMI 26.40 kg/m2 Physical Exam  Constitutional: He is oriented to person, place, and time. He appears well-developed and well-nourished.  HENT:  Head: Normocephalic and atraumatic.  Mouth/Throat: Oropharynx is clear and moist.  Eyes: Pupils are equal, round, and reactive to light.  Neck: Normal range of motion. Neck supple.  Cardiovascular: Normal rate and regular rhythm.   No murmur heard. Pulmonary/Chest: Effort normal and breath sounds normal.  Abdominal: Soft. Bowel sounds are normal. There is no tenderness.  Neurological: He is alert and oriented to person, place, and time.  Skin: Skin is warm and dry.  Psychiatric: He has a normal mood and affect.          Assessment & Plan:     ICD-9-CM ICD-10-CM   1. Acute bronchitis due to Rhinovirus 466.0 J20.6 azithromycin (ZITHROMAX) 250 MG tablet   079.3  HYDROcodone-homatropine (HYCODAN) 5-1.5 MG/5ML syrup     methylPREDNISolone acetate (DEPO-MEDROL) injection 80 mg     levalbuterol (XOPENEX) nebulizer solution 1.25 mg     Return if symptoms worsen or fail to improve.  Deatra CanterWilliam J Oxford FNP

## 2014-01-18 NOTE — Addendum Note (Signed)
Addended by: Fawn KirkHOLT, CATHY on: 01/18/2014 03:58 PM   Modules accepted: Orders, SmartSet

## 2014-07-10 ENCOUNTER — Ambulatory Visit: Payer: Federal, State, Local not specified - PPO | Attending: Orthopedic Surgery | Admitting: Physical Therapy

## 2014-07-10 DIAGNOSIS — M25561 Pain in right knee: Secondary | ICD-10-CM

## 2014-07-10 DIAGNOSIS — M6281 Muscle weakness (generalized): Secondary | ICD-10-CM

## 2014-07-10 NOTE — Therapy (Signed)
[name] [address] [place], [place], [address] Phone: [phone]   Fax:  [phone]  Physical Therapy Evaluation  Patient Details  Name: Scott RichStephen James MRN: [id number] Date of Birth: 07/03/1960 Referring Provider:  Beverely LowNorris, Steve, MD  Encounter Date: 07/10/2014      PT End of Session - 07/10/14 1327    Visit Number 1   Number of Visits 12   Date for PT Re-Evaluation 08/21/14   PT Start Time 1030   PT Stop Time 1117   PT Time Calculation (min) 47 min   Activity Tolerance Patient tolerated treatment well   Behavior During Therapy [facility] for tasks assessed/performed      Past Medical History  Diagnosis Date  . Bulging disc     L4-5  . Hypertension   . GERD (gastroesophageal reflux disease)   . Arthritis     rt ankle    Past Surgical History  Procedure Laterality Date  . Tonsillectomy    . Joint replacement      rt knee arthroscopy  . Ankle fracture surgery Right     shatterred talleous   . Hand surgery    . Cholecystectomy      3/32015    There were no vitals filed for this visit.  Visit Diagnosis:  Right knee pain - Plan: PT plan of care cert/re-cert  Quadriceps weakness - Plan: PT plan of care cert/re-cert      Subjective Assessment - 07/10/14 1320    Subjective Outside of knee continues to hurt.   Limitations Walking   How long can you walk comfortably? 15-20 minutes.   Patient Stated Goals Get out of pain.            [facility] - 07/10/14 0001    Assessment   Medical Diagnosis Right knee pain.   Precautions   Precautions --  Pain-free right quadriceps strengthening.   Restrictions   Weight Bearing Restrictions No   Balance Screen   Has the patient fallen in the past 6 months No   Has the patient had a decrease in activity level because of a fear of falling?  No   Is the patient reluctant to leave their home because of a fear of falling?  No   Home Public house managernvironment   Living Environment  Private residence   Prior Function   Level of Independence Independent   Observation/Other Assessments-Edema    Edema Circumferential   Circumferential Edema   Circumferential - Right Right 3 cms > left at apex of patella.   ROM / Strength   AROM / PROM / Strength AROM;Strength   AROM   Overall AROM Comments Right knee AROM is 0 to 120 degrees.   Strength   Overall Strength Comments Decreased volitional contraction of right quadriceps and VMO atrophy when contralaterally compared.   Palpation   Patella mobility --  Normal.   Palpation comment Tender lateral distal ITB and lateral joint line of right knee.   Ambulation/Gait   Gait Comments Mild gait antalgia noted.,                   [facility] Adult PT Treatment/Exercise - 07/10/14 0001    Modalities   Modalities Cryotherapy;Electrical Stimulation   Cryotherapy   Number Minutes Cryotherapy 20 Minutes   Cryotherapy Location --  Right knee.   Type of Cryotherapy --  Medium vasopneumatic.   Programme researcher, broadcasting/film/videolectrical Stimulation   Electrical Stimulation Location --  Right knee.   Statisticianlectrical Stimulation  Action --  1-10 HZ x 20 mins IFC at 100% scan x 20 minutes.   Electrical Stimulation Goals Edema;Pain                     PT Long Term Goals - 07/10/14 1332    PT LONG TERM GOAL #1   Title Ind with a HEP.   Time 4   Period Weeks   Status New   PT LONG TERM GOAL #2   Title Walk a community distance wiht pain not > 2-3/10.   Time 4   Period Weeks   Status New   PT LONG TERM GOAL #3   Title Perform ADL's with pain not > 2-3/10.   PT LONG TERM GOAL #4   Title Active right knee flexion= 125-130 degrees.   Time 4   Period Weeks   Status New   PT LONG TERM GOAL #5   Title Increase right knee strength to 5/5 to increase stability for functional tasks.   Time 4   Period Weeks   Status New               Plan - 07/10/14 1328    Clinical Impression Statement The patient underwent a right knee arthroscopic  surgery on 06/19/14.  He is pleased with his progress thus far.  His pain is a low 1-2/10 today but can go to higher levels (3-4+/10) with overuse.  Rest and elevation decrease his pain.   Pt will benefit from skilled therapeutic intervention in order to improve on the following deficits Pain;Decreased activity tolerance;Decreased range of motion;Decreased strength;Increased edema   Rehab Potential Excellent   PT Frequency 3x / week   PT Duration 4 weeks   PT Treatment/Interventions ADLs/Self Care Home Management;Cryotherapy;Neurosurgeon;Therapeutic activities;Therapeutic exercise;Neuromuscular re-education;Patient/family education;Vasopneumatic Device   PT Next Visit Plan Stationary bike and pain-free right quadriceps strengthening.   Consulted and Agree with Plan of Care Patient         Problem List Patient Active Problem List   Diagnosis Date Noted  . Chronic cholecystitis with calculus 02/13/2013  . Annual physical exam 02/06/2013  . Sinusitis nasal 02/06/2013  . Hypertension   . GERD (gastroesophageal reflux disease)   . Arthritis   . Bulging disc     [place], [place] MPT 07/10/2014, 1:36 PM  [facility] [address] [place], [place], [address] Phone: [phone]   Fax:  [phone]

## 2014-07-11 ENCOUNTER — Encounter: Payer: Self-pay | Admitting: Physical Therapy

## 2014-07-11 ENCOUNTER — Ambulatory Visit: Payer: Federal, State, Local not specified - PPO | Admitting: Physical Therapy

## 2014-07-11 DIAGNOSIS — M25561 Pain in right knee: Secondary | ICD-10-CM | POA: Diagnosis not present

## 2014-07-11 DIAGNOSIS — M6281 Muscle weakness (generalized): Secondary | ICD-10-CM

## 2014-07-11 NOTE — Therapy (Signed)
[name] [address] [place], [place], [address] Phone: [phone]   Fax:  [phone]  Physical Therapy Treatment  Patient Details  Name: Scott James MRN: [id number] Date of Birth: 1960/07/22 Referring Provider:  Deatra Canter, FNP  Encounter Date: 07/11/2014      PT End of Session - 07/11/14 1318    Visit Number 2   Number of Visits 12   Date for PT Re-Evaluation 08/21/14   PT Start Time 1233   PT Stop Time 1330   PT Time Calculation (min) 57 min   Activity Tolerance Patient tolerated treatment well   Behavior During Therapy [facility] for tasks assessed/performed      Past Medical History  Diagnosis Date  . Bulging disc     L4-5  . Hypertension   . GERD (gastroesophageal reflux disease)   . Arthritis     rt ankle    Past Surgical History  Procedure Laterality Date  . Tonsillectomy    . Joint replacement      rt knee arthroscopy  . Ankle fracture surgery Right     shatterred talleous   . Hand surgery    . Cholecystectomy      3/32015    There were no vitals filed for this visit.  Visit Diagnosis:  Right knee pain  Quadriceps weakness      Subjective Assessment - 07/11/14 1236    Subjective pain ranges from 0 at rest up to 4/10 with certain activity   Limitations Walking   How long can you walk comfortably? 15-20 minutes.   Patient Stated Goals Get out of pain.   Currently in Pain? Yes   Pain Score 4    Pain Location Knee   Pain Orientation Right   Pain Descriptors / Indicators Burning;Sore   Pain Type Surgical pain   Pain Onset 1 to 4 weeks ago   Pain Frequency Intermittent   Aggravating Factors  stepping wrong, prolong activity or increased activity   Pain Relieving Factors rest            [facility] PT Assessment - 07/11/14 0001    ROM / Strength   AROM / PROM / Strength AROM;PROM   AROM   Overall AROM  Deficits   AROM Assessment Site Knee   Right/Left Knee Right   Right Knee Flexion 115    PROM   Overall PROM  Deficits   Overall PROM Comments 123   PROM Assessment Site Knee   Right/Left Knee Right                     [facility] Adult PT Treatment/Exercise - 07/11/14 0001    Exercises   Exercises Knee/Hip   Knee/Hip Exercises: Aerobic   Nustep L4 x72min    Knee/Hip Exercises: Standing   Terminal Knee Extension Strengthening;Right;3 sets;10 reps  pink XTS   Lateral Step Up Right;3 sets;10 reps;Step Height: 6"   Forward Step Up Right;3 sets;10 reps;Step Height: 6"   Rocker Board 3 minutes   Modalities   Modalities Vasopneumatic   Programme researcher, broadcasting/film/video Location right knee   Electrical Stimulation Action IFC   Electrical Stimulation Parameters 1-10HZ    Electrical Stimulation Goals Edema;Pain   Vasopneumatic   Number Minutes Vasopneumatic  15 minutes   Vasopnuematic Location  Knee   Vasopneumatic Pressure Medium   Manual Therapy   Manual Therapy Passive ROM   Passive ROM low low holds with right knee flexion  PT Long Term Goals - 07/10/14 1332    PT LONG TERM GOAL #1   Title Ind with a HEP.   Time 4   Period Weeks   Status New   PT LONG TERM GOAL #2   Title Walk a community distance wiht pain not > 2-3/10.   Time 4   Period Weeks   Status New   PT LONG TERM GOAL #3   Title Perform ADL's with pain not > 2-3/10.   PT LONG TERM GOAL #4   Title Active right knee flexion= 125-130 degrees.   Time 4   Period Weeks   Status New   PT LONG TERM GOAL #5   Title Increase right knee strength to 5/5 to increase stability for functional tasks.   Time 4   Period Weeks   Status New               Plan - 07/11/14 1320    Clinical Impression Statement Patient is progressing well today. Had no pain increase with activity today. Patient has improved ROM for right knee flexion today both active and passive. Patient will be back to work next week.Goals ongoing due to pain, ROM ans strength  deficits.   Pt will benefit from skilled therapeutic intervention in order to improve on the following deficits Pain;Decreased activity tolerance;Decreased range of motion;Decreased strength;Increased edema   Rehab Potential Excellent   PT Frequency 3x / week   PT Duration 4 weeks   PT Treatment/Interventions ADLs/Self Care Home Management;Cryotherapy;Neurosurgeonlectrical Stimulation;Ultrasound;Gait training;Therapeutic activities;Therapeutic exercise;Neuromuscular re-education;Patient/family education;Vasopneumatic Device   PT Next Visit Plan Stationary bike and pain-free right quadriceps strengthening. (MD Ranell PatrickNorris 08/06/14)   Consulted and Agree with Plan of Care Patient        Problem List Patient Active Problem List   Diagnosis Date Noted  . Chronic cholecystitis with calculus 02/13/2013  . Annual physical exam 02/06/2013  . Sinusitis nasal 02/06/2013  . Hypertension   . GERD (gastroesophageal reflux disease)   . Arthritis   . Bulging disc     Vernadine Coombs P, PTA 07/11/2014, 1:32 PM  [name] [address] [place], [place], [address] Phone: [phone]   Fax:  [phone]

## 2014-07-15 ENCOUNTER — Ambulatory Visit: Payer: Federal, State, Local not specified - PPO | Admitting: Physical Therapy

## 2014-07-15 ENCOUNTER — Encounter: Payer: Self-pay | Admitting: Physical Therapy

## 2014-07-15 DIAGNOSIS — M25561 Pain in right knee: Secondary | ICD-10-CM

## 2014-07-15 DIAGNOSIS — M6281 Muscle weakness (generalized): Secondary | ICD-10-CM

## 2014-07-15 NOTE — Patient Instructions (Signed)
Knee Extension (Sitting)   Place __3-5__ pound weight on left ankle and straighten knee fully, lower slowly. Repeat _10___ times per set. Do __2-3__ sets per session. Do __2-3__ sessions per day.    Strengthening: Hip Abduction (Side-Lying)  Strengthening: Straight Leg Raise (Phase 1)  Repeat _10___ times per set. Do __2__ sets per session. Do __2__ sessions per day.

## 2014-07-15 NOTE — Therapy (Signed)
[name] [address] [name], [place], [address] Phone: [phone]   Fax:  [phone]  Physical Therapy Treatment  Patient Details  Name: Scott James MRN: [id number] Date of Birth: Jun 30, 1960 Referring Provider:  Deatra Canter, FNP  Encounter Date: 07/15/2014      PT End of Session - 07/15/14 1109    Visit Number 3   Number of Visits 12   Date for PT Re-Evaluation 08/21/14   PT Start Time 1031   PT Stop Time 1127   PT Time Calculation (min) 56 min   Activity Tolerance Patient tolerated treatment well   Behavior During Therapy [facility] for tasks assessed/performed      Past Medical History  Diagnosis Date  . Bulging disc     L4-5  . Hypertension   . GERD (gastroesophageal reflux disease)   . Arthritis     rt ankle    Past Surgical History  Procedure Laterality Date  . Tonsillectomy    . Joint replacement      rt knee arthroscopy  . Ankle fracture surgery Right     shatterred talleous   . Hand surgery    . Cholecystectomy      3/32015    There were no vitals filed for this visit.  Visit Diagnosis:  Right knee pain  Quadriceps weakness      Subjective Assessment - 07/15/14 1034    Subjective some soreness saturday yet feeling good today   Limitations Walking   How long can you walk comfortably? 15-20 minutes.   Patient Stated Goals Get out of pain.   Currently in Pain? Yes   Pain Score 3    Pain Location Knee   Pain Orientation Right   Pain Descriptors / Indicators Sore   Pain Type Surgical pain   Pain Onset 1 to 4 weeks ago   Pain Frequency Intermittent   Aggravating Factors  increased activity   Pain Relieving Factors rest            [facility] PT Assessment - 07/15/14 0001    AROM   Overall AROM  Deficits   AROM Assessment Site Knee   Right/Left Knee Right   Right Knee Flexion 120   PROM   Overall PROM  Deficits   Overall PROM Comments 124   PROM Assessment Site Knee   Right/Left Knee  Right                     [facility] Adult PT Treatment/Exercise - 07/15/14 0001    Knee/Hip Exercises: Aerobic   Nustep L4 x43min LE only    Knee/Hip Exercises: Standing   Terminal Knee Extension Strengthening;Right;3 sets;10 reps  Pink XTS   Lateral Step Up Right;3 sets;10 reps;Step Height: 6"   Forward Step Up Right;3 sets;10 reps;Step Height: 6"   Knee/Hip Exercises: Seated   Long Arc Quad Strengthening;Right;3 sets;10 reps   Long Arc Quad Weight 3 lbs.   Knee/Hip Exercises: Supine   Short Arc Quad Sets Strengthening;Right;3 sets;10 reps  4#   Knee/Hip Exercises: Sidelying   Hip ABduction AROM;Strengthening;Right;2 sets;10 reps   Programme researcher, broadcasting/film/video Location right knee   Electrical Stimulation Action IFC   Electrical Stimulation Parameters 1-10hz    Electrical Stimulation Goals Edema;Pain   Manual Therapy   Manual Therapy Passive ROM   Passive ROM low low holds with right knee flexion                PT  Education - 07/15/14 1119    Education provided Yes   Education Details HEP-strength   Person(s) Educated Patient   Methods Explanation;Demonstration;Handout   Comprehension Verbalized understanding;Returned demonstration             PT Long Term Goals - 07/10/14 1332    PT LONG TERM GOAL #1   Title Ind with a HEP.   Time 4   Period Weeks   Status New   PT LONG TERM GOAL #2   Title Walk a community distance wiht pain not > 2-3/10.   Time 4   Period Weeks   Status New   PT LONG TERM GOAL #3   Title Perform ADL's with pain not > 2-3/10.   PT LONG TERM GOAL #4   Title Active right knee flexion= 125-130 degrees.   Time 4   Period Weeks   Status New   PT LONG TERM GOAL #5   Title Increase right knee strength to 5/5 to increase stability for functional tasks.   Time 4   Period Weeks   Status New               Plan - 07/15/14 1111    Clinical Impression Statement Patient continues to progress with all  activities and has improved ROM in right knee today for both active and passive. Is progresing with pain free quad strength.goals ongoing due to pain, strength and ROM deficits.   Pt will benefit from skilled therapeutic intervention in order to improve on the following deficits Pain;Decreased activity tolerance;Decreased range of motion;Decreased strength;Increased edema   Rehab Potential Excellent   PT Frequency 3x / week   PT Treatment/Interventions ADLs/Self Care Home Management;Cryotherapy;Neurosurgeonlectrical Stimulation;Ultrasound;Gait training;Therapeutic activities;Therapeutic exercise;Neuromuscular re-education;Patient/family education;Vasopneumatic Device   PT Next Visit Plan Stationary bike and pain-free right quadriceps strengthening. (MD Ranell PatrickNorris 08/06/14)   Consulted and Agree with Plan of Care Patient        Problem List Patient Active Problem List   Diagnosis Date Noted  . Chronic cholecystitis with calculus 02/13/2013  . Annual physical exam 02/06/2013  . Sinusitis nasal 02/06/2013  . Hypertension   . GERD (gastroesophageal reflux disease)   . Arthritis   . Bulging disc     Dory Demont P, PTA 07/15/2014, 11:48 AM  [name] [address] [place], [place], [id number] Phone: [phone]   Fax:  [phone]

## 2014-07-18 ENCOUNTER — Encounter: Payer: Self-pay | Admitting: *Deleted

## 2014-07-18 ENCOUNTER — Ambulatory Visit: Payer: Federal, State, Local not specified - PPO | Admitting: *Deleted

## 2014-07-18 DIAGNOSIS — M25561 Pain in right knee: Secondary | ICD-10-CM | POA: Diagnosis not present

## 2014-07-18 DIAGNOSIS — M6281 Muscle weakness (generalized): Secondary | ICD-10-CM

## 2014-07-18 NOTE — Therapy (Addendum)
[name] [address] [place], [place], [address] Phone: [phone]   Fax:  [phone]  Physical Therapy Treatment  Patient Details  Name: Scott James MRN: [id number] Date of Birth: Apr 25, 1960 Referring Provider:  Deatra Canter, FNP  Encounter Date: 07/18/2014      PT End of Session - 07/18/14 1454    Visit Number 4   Number of Visits 12   Date for PT Re-Evaluation 08/21/14   PT Start Time 1347   PT Stop Time 1440   PT Time Calculation (min) 53 min      Past Medical History  Diagnosis Date  . Bulging disc     L4-5  . Hypertension   . GERD (gastroesophageal reflux disease)   . Arthritis     rt ankle    Past Surgical History  Procedure Laterality Date  . Tonsillectomy    . Joint replacement      rt knee arthroscopy  . Ankle fracture surgery Right     shatterred talleous   . Hand surgery    . Cholecystectomy      3/32015    There were no vitals filed for this visit.  Visit Diagnosis:  Right knee pain  Quadriceps weakness      Subjective Assessment - 07/18/14 1402    Subjective I noticed that my RT knee was swollen more on Wednesday, but  I had returned to work on Tuesday and was up and down a lot. I also removed the stocking that I was wearing.   Limitations Walking   How long can you walk comfortably? 15-20 minutes.   Patient Stated Goals Get out of pain.   Currently in Pain? Yes   Pain Location Knee   Pain Orientation Right   Pain Descriptors / Indicators Sore   Pain Type Surgical pain   Pain Onset 1 to 4 weeks ago   Pain Frequency Intermittent   Aggravating Factors  increased activity   Pain Relieving Factors rest            [facility] PT Assessment - 07/18/14 0001    Circumferential Edema   Circumferential - Right 41 cm at mid patella and ankle 21cm at malleoli   AROM   Overall AROM  Deficits   Overall AROM Comments Right knee AROM is 0 to 125 degrees.                      [facility] Adult PT Treatment/Exercise - 07/18/14 0001    Exercises   Exercises Knee/Hip   Knee/Hip Exercises: Aerobic   Nustep L4 x15 min LE only    Knee/Hip Exercises: Standing   Rocker Board 5 minutes   Knee/Hip Exercises: Seated   Long Arc Quad Strengthening;Right;3 sets;10 reps   Long Arc Quad Weight 3 lbs.  2#2x10   Knee/Hip Exercises: Supine   Short Arc Quad Sets Strengthening;Right;10 reps;5 sets  4#   Programme researcher, broadcasting/film/video Location right knee   Engineer, manufacturing IFC   Electrical Stimulation Parameters 1-10hz    Electrical Stimulation Goals Edema;Pain   Vasopneumatic   Number Minutes Vasopneumatic  15 minutes   Vasopnuematic Location  Knee   Vasopneumatic Pressure Medium                     PT Long Term Goals - 07/10/14 1332    PT LONG TERM GOAL #1   Title Ind with a HEP.   Time 4  Period Weeks   Status New   PT LONG TERM GOAL #2   Title Walk a community distance wiht pain not > 2-3/10.   Time 4   Period Weeks   Status New   PT LONG TERM GOAL #3   Title Perform ADL's with pain not > 2-3/10.   PT LONG TERM GOAL #4   Title Active right knee flexion= 125-130 degrees.   Time 4   Period Weeks   Status New   PT LONG TERM GOAL #5   Title Increase right knee strength to 5/5 to increase stability for functional tasks.   Time 4   Period Weeks   Status New               Plan - 07/18/14 1455    Clinical Impression Statement Pt still did very well with Rx today even with increased swelling. His swelling is more around the knee and down into the RT ankle. Pitting Edema was noted along the lower leg anterior aspect of the tibia. He  still had good VMO activation and was able to perform acts without pain increase. No loss of ROM due to increase swelling. Goals are ongoing still at this time. MPT was able to assess swelling and recommended Pt resume wearing pressure stocking.   Pt will benefit from skilled therapeutic  intervention in order to improve on the following deficits Pain;Decreased activity tolerance;Decreased range of motion;Decreased strength;Increased edema   Rehab Potential Excellent   PT Frequency 3x / week   PT Duration 4 weeks   PT Treatment/Interventions ADLs/Self Care Home Management;Cryotherapy;Neurosurgeonlectrical Stimulation;Ultrasound;Gait training;Therapeutic activities;Therapeutic exercise;Neuromuscular re-education;Patient/family education;Vasopneumatic Device   PT Next Visit Plan Stationary bike and pain-free right quadriceps strengthening. (MD Ranell PatrickNorris 08/06/14)  Assess swelling again   Consulted and Agree with Plan of Care Patient        Problem List Patient Active Problem List   Diagnosis Date Noted  . Chronic cholecystitis with calculus 02/13/2013  . Annual physical exam 02/06/2013  . Sinusitis nasal 02/06/2013  . Hypertension   . GERD (gastroesophageal reflux disease)   . Arthritis   . Bulging disc     RAMSEUR,CHRIS, PTA 07/18/2014, 3:24 PM  [name] [address] [place], [place], [address] Phone: [phone]   Fax:  [phone]

## 2014-07-22 ENCOUNTER — Ambulatory Visit: Payer: Federal, State, Local not specified - PPO | Admitting: Physical Therapy

## 2014-07-22 DIAGNOSIS — M25561 Pain in right knee: Secondary | ICD-10-CM | POA: Diagnosis not present

## 2014-07-22 DIAGNOSIS — M6281 Muscle weakness (generalized): Secondary | ICD-10-CM

## 2014-07-22 NOTE — Therapy (Signed)
[name] [address] [place], [place] [facility] [place], [id number] Phone: [phone]   Fax:  [phone]  Physical Therapy Treatment  Patient Details  Name: Scott RichStephen James MRN: [id number] Date of Birth: 03/23/1960 Referring Provider:  Deatra Canterxford, William J, FNP  Encounter Date: 07/22/2014      PT End of Session - 07/22/14 1306    Visit Number 5   Number of Visits 12   Date for PT Re-Evaluation 08/21/14   PT Start Time 0100   PT Stop Time 0149   PT Time Calculation (min) 49 min   Activity Tolerance Patient tolerated treatment well   Behavior During Therapy [facility] for tasks assessed/performed      Past Medical History  Diagnosis Date  . Bulging disc     L4-5  . Hypertension   . GERD (gastroesophageal reflux disease)   . Arthritis     rt ankle    Past Surgical History  Procedure Laterality Date  . Tonsillectomy    . Joint replacement      rt knee arthroscopy  . Ankle fracture surgery Right     shatterred talleous   . Hand surgery    . Cholecystectomy      3/32015    There were no vitals filed for this visit.  Visit Diagnosis:  Right knee pain  Quadriceps weakness      Subjective Assessment - 07/22/14 1304    Subjective Did a lot of yardwork yesterday and a lot of mowing and walking without TED hose and knee did not swell hardly at all.   Limitations Walking   How long can you walk comfortably? 15-20 minutes.   Patient Stated Goals Get out of pain.   Pain Score 3    Pain Location Knee   Pain Orientation Right   Pain Descriptors / Indicators Dull;Sore   Pain Type Surgical pain   Pain Onset 1 to 4 weeks ago   Pain Frequency Intermittent     Stationary bile level 2 x 15 minutes  SAQ's x 10 minutes with 2# facilitated with VMS to right medial quadriceps (10 sec extension holds and 10 sec rest.                    [facility] Adult PT Treatment/Exercise - 07/22/14 1340    Electrical Stimulation   Electrical Stimulation  Location right knee   Electrical Stimulation Action IFC   Electrical Stimulation Parameters --  15 minutes.   Electrical Stimulation Goals Edema;Pain      Medium vasopneumatic to right knee elevated x 15 minutes.                 PT Long Term Goals - 07/10/14 1332    PT LONG TERM GOAL #1   Title Ind with a HEP.   Time 4   Period Weeks   Status New   PT LONG TERM GOAL #2   Title Walk a community distance wiht pain not > 2-3/10.   Time 4   Period Weeks   Status New   PT LONG TERM GOAL #3   Title Perform ADL's with pain not > 2-3/10.   PT LONG TERM GOAL #4   Title Active right knee flexion= 125-130 degrees.   Time 4   Period Weeks   Status New   PT LONG TERM GOAL #5   Title Increase right knee strength to 5/5 to increase stability for functional tasks.   Time 4   Period Weeks  Status New               Problem List Patient Active Problem List   Diagnosis Date Noted  . Chronic cholecystitis with calculus 02/13/2013  . Annual physical exam 02/06/2013  . Sinusitis nasal 02/06/2013  . Hypertension   . GERD (gastroesophageal reflux disease)   . Arthritis   . Bulging disc     Anona Giovannini, [place] MPT 07/22/2014, 2:26 PM  [facility] [address] [place], [place], [address] Phone: [phone]   Fax:  [phone]

## 2014-07-26 ENCOUNTER — Encounter: Payer: Self-pay | Admitting: Physical Therapy

## 2014-07-26 ENCOUNTER — Ambulatory Visit: Payer: Federal, State, Local not specified - PPO | Admitting: Physical Therapy

## 2014-07-26 DIAGNOSIS — M25561 Pain in right knee: Secondary | ICD-10-CM

## 2014-07-26 DIAGNOSIS — M6281 Muscle weakness (generalized): Secondary | ICD-10-CM

## 2014-07-26 NOTE — Therapy (Signed)
[name] [address] [place], [place], [address] Phone: [phone]   Fax:  [phone]  Physical Therapy Treatment  Patient Details  Name: Scott James MRN: [id number] Date of Birth: Apr 08, 1960 Referring Provider:  Deatra Canter, FNP  Encounter Date: 07/26/2014      PT End of Session - 07/26/14 1122    Visit Number 6   Number of Visits 12   Date for PT Re-Evaluation 08/21/14   PT Start Time 1119   PT Stop Time 1213   PT Time Calculation (min) 54 min   Activity Tolerance Patient tolerated treatment well   Behavior During Therapy [facility] for tasks assessed/performed      Past Medical History  Diagnosis Date  . Bulging disc     L4-5  . Hypertension   . GERD (gastroesophageal reflux disease)   . Arthritis     rt ankle    Past Surgical History  Procedure Laterality Date  . Tonsillectomy    . Joint replacement      rt knee arthroscopy  . Ankle fracture surgery Right     shatterred talleous   . Hand surgery    . Cholecystectomy      3/32015    There were no vitals filed for this visit.  Visit Diagnosis:  Right knee pain  Quadriceps weakness      Subjective Assessment - 07/26/14 1121    Subjective Reports that his RLE is swollen from knee to ankle again today but not to degree that it was when the TED hose had to be used again. States that he may get in his pool this weekend.   Limitations Walking   How long can you walk comfortably? 15-20 minutes.   Patient Stated Goals Get out of pain.   Currently in Pain? No/denies            [facility] PT Assessment - 07/26/14 0001    Assessment   Medical Diagnosis Right knee pain.   Observation/Other Assessments-Edema    Edema Circumferential   Circumferential Edema   Circumferential - Right R midpalla 41.5 cm, R proximal ankle/lower leg 23.9 cm                     [facility] Adult PT Treatment/Exercise - 07/26/14 0001    Knee/Hip Exercises: Aerobic   Stationary Bike x10 min   Knee/Hip Exercises: Standing   Terminal Knee Extension Strengthening;Right;3 sets;10 reps;Other (comment)  Pink XTS   Lateral Step Up Right;3 sets;10 reps;Step Height: 6"   Forward Step Up Right;3 sets;10 reps;Step Height: 6"   Step Down Right;3 sets;10 reps;Hand Hold: 2;Step Height: 4"   Knee/Hip Exercises: Seated   Long Arc Quad Strengthening;Right;3 sets;10 reps   Long Arc Quad Weight 3 lbs.   Modalities   Modalities Office manager Location right knee   Electrical Stimulation Action IFC   Electrical Stimulation Parameters 1-10 Hz x12min   Electrical Stimulation Goals Edema;Pain   Vasopneumatic   Number Minutes Vasopneumatic  15 minutes   Vasopnuematic Location  Knee   Vasopneumatic Pressure Medium   Vasopneumatic Temperature  36                     PT Long Term Goals - 07/10/14 1332    PT LONG TERM GOAL #1   Title Ind with a HEP.   Time 4   Period Weeks   Status New   PT  LONG TERM GOAL #2   Title Walk a community distance wiht pain not > 2-3/10.   Time 4   Period Weeks   Status New   PT LONG TERM GOAL #3   Title Perform ADL's with pain not > 2-3/10.   PT LONG TERM GOAL #4   Title Active right knee flexion= 125-130 degrees.   Time 4   Period Weeks   Status New   PT LONG TERM GOAL #5   Title Increase right knee strength to 5/5 to increase stability for functional tasks.   Time 4   Period Weeks   Status New               Plan - 07/26/14 1204    Clinical Impression Statement Patient continues to do very well in therapy without complaint of increased pain. Has swelling from R knee to R ankle today with mild pitting noted during palpation around the anterior tibia and ankle today in clinic. Completes all exercises without pain verbalized only burning during step down on 4" step. Edema measured at 41.5 cm around mid patella and 23.9 cm in the proximal  ankle/ lower leg. Normal modalities response noted following removal of the modalities. Denied pain following treatment.   Pt will benefit from skilled therapeutic intervention in order to improve on the following deficits Pain;Decreased activity tolerance;Decreased range of motion;Decreased strength;Increased edema   Rehab Potential Excellent   PT Frequency 3x / week   PT Duration 4 weeks   PT Treatment/Interventions ADLs/Self Care Home Management;Cryotherapy;Neurosurgeon;Therapeutic activities;Therapeutic exercise;Neuromuscular re-education;Patient/family education;Vasopneumatic Device   PT Next Visit Plan Continue pain free R Quadriceps strengthening and continue assessing RLE edema per MPT POC. MD appt with Dr. Ranell Patrick 08/06/2014.   Consulted and Agree with Plan of Care Patient        Problem List Patient Active Problem List   Diagnosis Date Noted  . Chronic cholecystitis with calculus 02/13/2013  . Annual physical exam 02/06/2013  . Sinusitis nasal 02/06/2013  . Hypertension   . GERD (gastroesophageal reflux disease)   . Arthritis   . Bulging disc     Evelene Croon, PTA 07/26/2014, 12:18 PM  [name] [address] [place], [place], [address] Phone: [phone]   Fax:  [phone]

## 2014-07-31 ENCOUNTER — Ambulatory Visit: Payer: Federal, State, Local not specified - PPO | Admitting: Physical Therapy

## 2014-07-31 DIAGNOSIS — M25561 Pain in right knee: Secondary | ICD-10-CM | POA: Diagnosis not present

## 2014-07-31 DIAGNOSIS — M6281 Muscle weakness (generalized): Secondary | ICD-10-CM

## 2014-07-31 NOTE — Therapy (Signed)
[name] [address] [place], [place], [address] Phone: [phone]   Fax:  [phone]  Physical Therapy Treatment  Patient Details  Name: Scott James MRN: [id number] Date of Birth: 1960/11/06 Referring Provider:  Deatra Canter, FNP  Encounter Date: 07/31/2014      PT End of Session - 07/31/14 1445    Visit Number 7   Number of Visits 12   Date for PT Re-Evaluation 08/21/14   PT Start Time 0100   PT Stop Time 0147   PT Time Calculation (min) 47 min   Activity Tolerance Patient tolerated treatment well   Behavior During Therapy [facility] for tasks assessed/performed      Past Medical History  Diagnosis Date  . Bulging disc     L4-5  . Hypertension   . GERD (gastroesophageal reflux disease)   . Arthritis     rt ankle    Past Surgical History  Procedure Laterality Date  . Tonsillectomy    . Joint replacement      rt knee arthroscopy  . Ankle fracture surgery Right     shatterred talleous   . Hand surgery    . Cholecystectomy      3/32015    There were no vitals filed for this visit.  Visit Diagnosis:  Right knee pain  Quadriceps weakness      Subjective Assessment - 07/31/14 1328    Subjective Knee feels good today.   Pain Score 2    Pain Location Knee   Pain Orientation Right   Pain Descriptors / Indicators Sore   Pain Type Surgical pain   Pain Onset More than a month ago                         [facility] Adult PT Treatment/Exercise - 07/31/14 0001    Exercises   Exercises Knee/Hip   Knee/Hip Exercises: Aerobic   Stationary Bike Level 2 progressing to level 3 x 15 minutes.   Knee/Hip Exercises: Standing   Terminal Knee Extension Limitations SAQ's with 5# x 15 minutes facilitated with VMS to right medial quadriceps (10 sec extension holds and 10 sec rest).   Vasopneumatic   Number Minutes Vasopneumatic  15 minutes   Vasopnuematic Location  --  Left knee.   Vasopneumatic Pressure --   High vasopnuematic.                     PT Long Term Goals - 07/10/14 1332    PT LONG TERM GOAL #1   Title Ind with a HEP.   Time 4   Period Weeks   Status New   PT LONG TERM GOAL #2   Title Walk a community distance wiht pain not > 2-3/10.   Time 4   Period Weeks   Status New   PT LONG TERM GOAL #3   Title Perform ADL's with pain not > 2-3/10.   PT LONG TERM GOAL #4   Title Active right knee flexion= 125-130 degrees.   Time 4   Period Weeks   Status New   PT LONG TERM GOAL #5   Title Increase right knee strength to 5/5 to increase stability for functional tasks.   Time 4   Period Weeks   Status New               Problem List Patient Active Problem List   Diagnosis Date Noted  . Chronic cholecystitis with calculus  02/13/2013  . Annual physical exam 02/06/2013  . Sinusitis nasal 02/06/2013  . Hypertension   . GERD (gastroesophageal reflux disease)   . Arthritis   . Bulging disc     Adreena Willits, [place] MPT 07/31/2014, 2:49 PM  [facility] [address] [place], [place], [address] Phone: [phone]   Fax:  [phone]

## 2014-08-02 ENCOUNTER — Encounter: Payer: Self-pay | Admitting: *Deleted

## 2014-08-02 ENCOUNTER — Ambulatory Visit: Payer: Federal, State, Local not specified - PPO | Admitting: *Deleted

## 2014-08-02 DIAGNOSIS — M6281 Muscle weakness (generalized): Secondary | ICD-10-CM

## 2014-08-02 DIAGNOSIS — M25561 Pain in right knee: Secondary | ICD-10-CM

## 2014-08-02 NOTE — Therapy (Addendum)
[name] [place], [place], [address] Phone: [phone]   Fax:  [phone]  Physical Therapy Treatment  Patient Details  Name: Scott James MRN: [id number] Date of Birth: 09-08-1960 Referring Provider:  Lysbeth Penner, FNP  Encounter Date: 08/02/2014      PT End of Session - 08/02/14 0913    Visit Number 8   Number of Visits 12   Date for PT Re-Evaluation 08/21/14   PT Start Time 0900   PT Stop Time 0951   PT Time Calculation (min) 51 min      Past Medical History  Diagnosis Date  . Bulging disc     L4-5  . Hypertension   . GERD (gastroesophageal reflux disease)   . Arthritis     rt ankle    Past Surgical History  Procedure Laterality Date  . Tonsillectomy    . Joint replacement      rt knee arthroscopy  . Ankle fracture surgery Right     shatterred talleous   . Hand surgery    . Cholecystectomy      3/32015    There were no vitals filed for this visit.  Visit Diagnosis:  Right knee pain  Quadriceps weakness      Subjective Assessment - 08/02/14 0913    Subjective Knee feels good today.   Limitations Walking   How long can you walk comfortably? 15-20 minutes.   Patient Stated Goals Get out of pain.   Pain Score 2    Pain Location Knee   Pain Orientation Right   Pain Descriptors / Indicators Sore   Pain Type Surgical pain   Pain Onset More than a month ago   Aggravating Factors  increased activity   Pain Relieving Factors rest            [facility] PT Assessment - 08/02/14 0001    Circumferential Edema   Circumferential - Right 40.5cm mid patella; R ankle at lateral malleoli 26.5  cm    AROM   Overall AROM Comments Right knee AROM is 0 to 135 degrees.                     [facility] Adult PT Treatment/Exercise - 08/02/14 0001    Exercises   Exercises Knee/Hip   Knee/Hip Exercises: Aerobic   Stationary Bike Level 2 progressing to level 3 x 15 minutes.   Knee/Hip  Exercises: Standing   Terminal Knee Extension Limitations SAQ's with 5# x 15 minutes facilitated with VMS to right medial quadriceps (10 sec extension holds and 10 sec rest).   Modalities   Modalities Designer, multimedia Location right knee   Electrical Stimulation Action IFC 1-10 HZ and VMS   Electrical Stimulation Goals Edema;Pain   Vasopneumatic   Number Minutes Vasopneumatic  15 minutes   Vasopnuematic Location  --  Left knee.   Vasopneumatic Pressure --  High vasopnuematic.                     PT Long Term Goals - 08/02/14 0915    PT LONG TERM GOAL #1   Title Ind with a HEP.   Time 4   Period Weeks   Status On-going   PT LONG TERM GOAL #2   Title Walk a community distance wiht pain not > 2-3/10.   Time 4   Period Weeks   Status On-going   PT LONG TERM  GOAL #3   Title Perform ADL's with pain not > 2-3/10.   Status On-going   PT LONG TERM GOAL #4   Title Active right knee flexion= 125-130 degrees.   Status Achieved               Plan - 08/02/14 0914    Clinical Impression Statement Pt continues to progress with PT ex.'s and Act.'s. He has increased RT VMO activation and strength, increased ROM, and decreased swelling. He was able to meet ROM goal today as well. Other goals are on going   Rehab Potential Excellent   PT Frequency 3x / week   PT Treatment/Interventions ADLs/Self Care Home Management;Cryotherapy;Pharmacist, hospital;Therapeutic activities;Therapeutic exercise;Neuromuscular re-education;Patient/family education;Vasopneumatic Device   PT Next Visit Plan Continue pain free R Quadriceps strengthening and continue assessing RLE edema per MPT POC. MD appt with Dr. Veverly Fells 08/06/2014.   Consulted and Agree with Plan of Care Patient        Problem List Patient Active Problem List   Diagnosis Date Noted  . Chronic cholecystitis with calculus  02/13/2013  . Annual physical exam 02/06/2013  . Sinusitis nasal 02/06/2013  . Hypertension   . GERD (gastroesophageal reflux disease)   . Arthritis   . Bulging disc     Krisy Dix,CHRIS, PTA 08/02/2014, 9:55 AM   Mali Applegate MPT  [facility] [place], [place], [address] Phone: [phone]   Fax:  [phone]   PHYSICAL THERAPY DISCHARGE SUMMARY  Visits from Start of Care: 8.  Current functional level related to goals / functional outcomes: Please see above.   Remaining deficits: Continued pain.   Education / Equipment: HEP. Plan: Patient agrees to discharge.  Patient goals were partially met. Patient is being discharged due to meeting the stated rehab goals.  ?????         Mali Applegate MPT

## 2015-12-17 DIAGNOSIS — Z0189 Encounter for other specified special examinations: Secondary | ICD-10-CM | POA: Diagnosis not present

## 2015-12-22 ENCOUNTER — Ambulatory Visit (INDEPENDENT_AMBULATORY_CARE_PROVIDER_SITE_OTHER): Payer: Federal, State, Local not specified - PPO | Admitting: Family Medicine

## 2015-12-22 ENCOUNTER — Encounter: Payer: Self-pay | Admitting: Family Medicine

## 2015-12-22 DIAGNOSIS — J209 Acute bronchitis, unspecified: Secondary | ICD-10-CM | POA: Diagnosis not present

## 2015-12-22 MED ORDER — HYDROCODONE-HOMATROPINE 5-1.5 MG/5ML PO SYRP: 5.0000 mL | ORAL_SOLUTION | Freq: Three times a day (TID) | ORAL | 0 refills | Status: DC | PRN

## 2015-12-22 MED ORDER — AZITHROMYCIN 250 MG PO TABS: ORAL_TABLET | ORAL | 0 refills | Status: DC

## 2015-12-22 NOTE — Progress Notes (Signed)
BP (!) 138/91   Pulse 83   Temp 97.8 F (36.6 C) (Oral)   Ht 5\' 10"  (1.778 m)   Wt 177 lb 12.8 oz (80.6 kg)   BMI 25.51 kg/m    Subjective:    Patient ID: Scott James, male    DOB: 06/20/1960, 55 y.o.   MRN: [id number]  HPI: Scott James is a 55 y.o. male presenting on 12/22/2015 for Cough (started last Thursday, non productive no known fever); Sore Throat; and Nasal Congestion   HPI Cough and sore throat and nasal congestion Patient has been having cough and sore throat and nasal congestion and sinus congestion and postnasal drainage has been going on for the past 4 days. His cough is been mostly nonproductive. He does not know if he's had a fever. He denies any shortness of breath or wheezing. He has tried some Tylenol Sinus and cold medication and DayQuil and NyQuil without much success. He feels like it's worsened over the past couple days. He says it's a lot worse at night and early morning when he gets up. He is not a smoker but he lives with a smoker.  Relevant past medical, surgical, family and social history reviewed and updated as indicated. Interim medical history since our last visit reviewed. Allergies and medications reviewed and updated.  Review of Systems  Constitutional: Negative for chills and fever.  HENT: Positive for congestion, postnasal drip, rhinorrhea, sinus pressure and sore throat. Negative for ear discharge, ear pain, sneezing and voice change.   Eyes: Negative for pain, discharge, redness and visual disturbance.  Respiratory: Positive for cough. Negative for shortness of breath and wheezing.   Cardiovascular: Negative for chest pain and leg swelling.  Musculoskeletal: Negative for gait problem.  Skin: Negative for rash.  All other systems reviewed and are negative.   Per HPI unless specifically indicated above        Objective:    BP (!) 138/91   Pulse 83   Temp 97.8 F (36.6 C) (Oral)   Ht 5\' 10"  (1.778 m)   Wt 177 lb 12.8 oz (80.6 kg)    BMI 25.51 kg/m   Wt Readings from Last 3 Encounters:  12/22/15 177 lb 12.8 oz (80.6 kg)  01/18/14 184 lb (83.5 kg)  11/23/13 181 lb 12.8 oz (82.5 kg)    Physical Exam  Constitutional: He is oriented to person, place, and time. He appears well-developed and well-nourished. No distress.  HENT:  Right Ear: Tympanic membrane, external ear and ear canal normal.  Left Ear: Tympanic membrane, external ear and ear canal normal.  Nose: Mucosal edema and rhinorrhea present. No sinus tenderness. No epistaxis. Right sinus exhibits maxillary sinus tenderness. Right sinus exhibits no frontal sinus tenderness. Left sinus exhibits maxillary sinus tenderness. Left sinus exhibits no frontal sinus tenderness.  Mouth/Throat: Uvula is midline and mucous membranes are normal. Posterior oropharyngeal edema and posterior oropharyngeal erythema present. No oropharyngeal exudate or tonsillar abscesses.  Eyes: Conjunctivae are normal. Right eye exhibits no discharge. Left eye exhibits no discharge. No scleral icterus.  Neck: Neck supple. No thyromegaly present.  Cardiovascular: Normal rate, regular rhythm, normal heart sounds and intact distal pulses.   No murmur heard. Pulmonary/Chest: Effort normal. No respiratory distress. He has no wheezes. He has rhonchi (Throughout upper lobes). He has no rales.  Musculoskeletal: Normal range of motion. He exhibits no edema.  Lymphadenopathy:    He has no cervical adenopathy.  Neurological: He is alert and oriented to person, place, and  time. Coordination normal.  Skin: Skin is warm and dry. No rash noted. He is not diaphoretic.  Psychiatric: He has a normal mood and affect. His behavior is normal.  Nursing note and vitals reviewed.     Assessment & Plan:   Problem List Items Addressed This Visit    None    Visit Diagnoses    Acute bronchitis, unspecified organism       Relevant Medications   azithromycin (ZITHROMAX) 250 MG tablet   HYDROcodone-homatropine  (HYCODAN) 5-1.5 MG/5ML syrup       Follow up plan: Return if symptoms worsen or fail to improve.  Counseling provided for all of the vaccine components No orders of the defined types were placed in this encounter.   Arville CareJoshua Emiley Digiacomo, MD [facility] 12/22/2015, 5:36 PM

## 2016-01-05 HISTORY — PX: KNEE ARTHROSCOPY: SUR90

## 2016-03-15 DIAGNOSIS — I781 Nevus, non-neoplastic: Secondary | ICD-10-CM | POA: Diagnosis not present

## 2016-03-15 DIAGNOSIS — L570 Actinic keratosis: Secondary | ICD-10-CM | POA: Diagnosis not present

## 2016-03-15 DIAGNOSIS — L820 Inflamed seborrheic keratosis: Secondary | ICD-10-CM | POA: Diagnosis not present

## 2016-03-26 DIAGNOSIS — H40033 Anatomical narrow angle, bilateral: Secondary | ICD-10-CM | POA: Diagnosis not present

## 2016-03-26 DIAGNOSIS — H04123 Dry eye syndrome of bilateral lacrimal glands: Secondary | ICD-10-CM | POA: Diagnosis not present

## 2016-04-03 DIAGNOSIS — G44219 Episodic tension-type headache, not intractable: Secondary | ICD-10-CM | POA: Diagnosis not present

## 2016-05-08 DIAGNOSIS — H1013 Acute atopic conjunctivitis, bilateral: Secondary | ICD-10-CM | POA: Diagnosis not present

## 2016-05-12 DIAGNOSIS — K080 Exfoliation of teeth due to systemic causes: Secondary | ICD-10-CM | POA: Diagnosis not present

## 2016-06-21 DIAGNOSIS — K080 Exfoliation of teeth due to systemic causes: Secondary | ICD-10-CM | POA: Diagnosis not present

## 2016-08-02 DIAGNOSIS — K080 Exfoliation of teeth due to systemic causes: Secondary | ICD-10-CM | POA: Diagnosis not present

## 2016-08-11 DIAGNOSIS — M794 Hypertrophy of (infrapatellar) fat pad: Secondary | ICD-10-CM | POA: Diagnosis not present

## 2016-08-11 DIAGNOSIS — M11262 Other chondrocalcinosis, left knee: Secondary | ICD-10-CM | POA: Diagnosis not present

## 2016-09-15 DIAGNOSIS — M76892 Other specified enthesopathies of left lower limb, excluding foot: Secondary | ICD-10-CM | POA: Diagnosis not present

## 2016-09-15 DIAGNOSIS — M25562 Pain in left knee: Secondary | ICD-10-CM | POA: Diagnosis not present

## 2016-09-15 DIAGNOSIS — G8929 Other chronic pain: Secondary | ICD-10-CM | POA: Diagnosis not present

## 2016-09-15 DIAGNOSIS — M948X8 Other specified disorders of cartilage, other site: Secondary | ICD-10-CM | POA: Diagnosis not present

## 2016-09-28 DIAGNOSIS — G8929 Other chronic pain: Secondary | ICD-10-CM | POA: Diagnosis not present

## 2016-09-28 DIAGNOSIS — M25562 Pain in left knee: Secondary | ICD-10-CM | POA: Diagnosis not present

## 2016-10-06 DIAGNOSIS — G8918 Other acute postprocedural pain: Secondary | ICD-10-CM | POA: Diagnosis not present

## 2016-10-06 DIAGNOSIS — M2242 Chondromalacia patellae, left knee: Secondary | ICD-10-CM | POA: Diagnosis not present

## 2016-10-06 DIAGNOSIS — M23332 Other meniscus derangements, other medial meniscus, left knee: Secondary | ICD-10-CM | POA: Diagnosis not present

## 2016-10-06 DIAGNOSIS — S83232A Complex tear of medial meniscus, current injury, left knee, initial encounter: Secondary | ICD-10-CM | POA: Diagnosis not present

## 2016-10-06 DIAGNOSIS — M23362 Other meniscus derangements, other lateral meniscus, left knee: Secondary | ICD-10-CM | POA: Diagnosis not present

## 2017-02-16 ENCOUNTER — Ambulatory Visit: Payer: Federal, State, Local not specified - PPO | Admitting: Family Medicine

## 2017-02-16 ENCOUNTER — Encounter: Payer: Self-pay | Admitting: Family Medicine

## 2017-02-16 VITALS — BP 161/89 | HR 93 | Temp 98.1°F | Ht 70.0 in | Wt 180.0 lb

## 2017-02-16 DIAGNOSIS — K645 Perianal venous thrombosis: Secondary | ICD-10-CM

## 2017-02-16 NOTE — Progress Notes (Signed)
BP (!) 161/89   Pulse 93   Temp 98.1 F (36.7 C) (Oral)   Ht 5\' 10"  (1.778 m)   Wt 180 lb (81.6 kg)   BMI 25.83 kg/m    Subjective:    Patient ID: Scott James, male    DOB: 06/28/1960, 57 y.o.   MRN: [id number]  HPI: Scott James is a 57 y.o. male presenting on 02/16/2017 for Hemorrhoids (began about a week ago)   HPI Painful hemorrhoids Patient is coming in with a hemorrhoid that began about a week ago.  He says that he was trying to have a bowel movement all of sudden felt something painful and hard pop out there.  He denies any bleeding.  He has had this once before in the past.  He denies any constipation or diarrhea.  He would like to have it resolved.  He had to have it cut one time in the past.  Relevant past medical, surgical, family and social history reviewed and updated as indicated. Interim medical history since our last visit reviewed. Allergies and medications reviewed and updated.  Review of Systems  Constitutional: Negative for chills and fever.  Respiratory: Negative for shortness of breath and wheezing.   Cardiovascular: Negative for chest pain and leg swelling.  Gastrointestinal: Positive for rectal pain. Negative for anal bleeding, constipation, diarrhea, nausea and vomiting.  Musculoskeletal: Negative for back pain and gait problem.  Skin: Negative for rash.  All other systems reviewed and are negative.   Per HPI unless specifically indicated above   Allergies as of 02/16/2017      Reactions   Bee Venom       Medication List        Accurate as of 02/16/17  1:27 PM. Always use your most recent med list.          EPIPEN 2-PAK 0.3 mg/0.3 mL Soaj injection Generic drug:  EPINEPHrine INJECT IM AS DIRECTED   naproxen sodium 220 MG tablet Commonly known as:  ALEVE Take 220 mg by mouth.          Objective:    BP (!) 161/89   Pulse 93   Temp 98.1 F (36.7 C) (Oral)   Ht 5\' 10"  (1.778 m)   Wt 180 lb (81.6 kg)   BMI 25.83 kg/m   Wt  Readings from Last 3 Encounters:  02/16/17 180 lb (81.6 kg)  12/22/15 177 lb 12.8 oz (80.6 kg)  01/18/14 184 lb (83.5 kg)    Physical Exam  Constitutional: He is oriented to person, place, and time. He appears well-developed and well-nourished. No distress.  Eyes: Conjunctivae are normal. No scleral icterus.  Genitourinary: Rectal exam shows external hemorrhoid (Large rectal thrombosed hemorrhoid).     Musculoskeletal: Normal range of motion. He exhibits no edema.  Neurological: He is alert and oriented to person, place, and time. Coordination normal.  Skin: Skin is warm and dry. No rash noted. He is not diaphoretic.  Psychiatric: He has a normal mood and affect. His behavior is normal.  Nursing note and vitals reviewed.       Assessment & Plan:   Problem List Items Addressed This Visit    None    Visit Diagnoses    Thrombosed external hemorrhoid    -  Primary   Relevant Orders   Ambulatory referral to General Surgery       Follow up plan: Return if symptoms worsen or fail to improve.  Counseling provided for all of the vaccine  components Orders Placed This Encounter  Procedures  . Ambulatory referral to General Surgery    Arville Care, MD [facility] 02/16/2017, 1:27 PM

## 2017-02-17 DIAGNOSIS — K645 Perianal venous thrombosis: Secondary | ICD-10-CM | POA: Diagnosis not present

## 2017-03-10 DIAGNOSIS — K645 Perianal venous thrombosis: Secondary | ICD-10-CM | POA: Diagnosis not present

## 2017-03-25 DIAGNOSIS — H40033 Anatomical narrow angle, bilateral: Secondary | ICD-10-CM | POA: Diagnosis not present

## 2017-03-25 DIAGNOSIS — H04123 Dry eye syndrome of bilateral lacrimal glands: Secondary | ICD-10-CM | POA: Diagnosis not present

## 2017-03-30 ENCOUNTER — Encounter: Payer: Self-pay | Admitting: Gastroenterology

## 2017-03-30 ENCOUNTER — Encounter: Payer: Self-pay | Admitting: Family Medicine

## 2017-03-30 ENCOUNTER — Ambulatory Visit (INDEPENDENT_AMBULATORY_CARE_PROVIDER_SITE_OTHER): Payer: Federal, State, Local not specified - PPO | Admitting: Family Medicine

## 2017-03-30 VITALS — BP 139/87 | HR 77 | Temp 97.5°F | Ht 70.0 in | Wt 180.0 lb

## 2017-03-30 DIAGNOSIS — Z0000 Encounter for general adult medical examination without abnormal findings: Secondary | ICD-10-CM

## 2017-03-30 DIAGNOSIS — I10 Essential (primary) hypertension: Secondary | ICD-10-CM

## 2017-03-30 DIAGNOSIS — Z1211 Encounter for screening for malignant neoplasm of colon: Secondary | ICD-10-CM

## 2017-03-30 DIAGNOSIS — K219 Gastro-esophageal reflux disease without esophagitis: Secondary | ICD-10-CM

## 2017-03-30 NOTE — Progress Notes (Signed)
BP 139/87   Pulse 77   Temp (!) 97.5 F (36.4 C) (Oral)   Ht '5\' 10"'  (1.778 m)   Wt 180 lb (81.6 kg)   BMI 25.83 kg/m    Subjective:    Patient ID: Scott James, male    DOB: 1960/07/11, 57 y.o.   MRN: [id number]  HPI: Scott James is a 57 y.o. male presenting on 03/30/2017 for No chief complaint on file.   HPI Adult well exam and physical Patient is coming in today for adult well exam and physical and recheck on blood pressure and GERD.  He says he is doing very well on his current dietary changes very happy with where he is at and his blood pressure today was 139/87.  He is and has lost some weight over the past couple years and is feeling a lot better. Patient denies any chest pain, shortness of breath, headaches or vision issues, abdominal complaints, diarrhea, nausea, vomiting, or joint issues.   Relevant past medical, surgical, family and social history reviewed and updated as indicated. Interim medical history since our last visit reviewed. Allergies and medications reviewed and updated.  Review of Systems  Constitutional: Negative for chills and fever.  HENT: Negative for ear pain and tinnitus.   Eyes: Negative for pain and discharge.  Respiratory: Negative for cough, shortness of breath and wheezing.   Cardiovascular: Negative for chest pain, palpitations and leg swelling.  Gastrointestinal: Negative for abdominal pain, blood in stool, constipation and diarrhea.  Genitourinary: Negative for dysuria and hematuria.  Musculoskeletal: Negative for back pain, gait problem and myalgias.  Skin: Negative for rash.  Neurological: Negative for dizziness, weakness and headaches.  Psychiatric/Behavioral: Negative for suicidal ideas.  All other systems reviewed and are negative.   Per HPI unless specifically indicated above   Allergies as of 03/30/2017      Reactions   Bee Venom       Medication List        Accurate as of 03/30/17 10:49 AM. Always use your most recent med  list.          EPIPEN 2-PAK 0.3 mg/0.3 mL Soaj injection Generic drug:  EPINEPHrine INJECT IM AS DIRECTED   naproxen sodium 220 MG tablet Commonly known as:  ALEVE Take 220 mg by mouth.          Objective:    There were no vitals taken for this visit.  Wt Readings from Last 3 Encounters:  02/16/17 180 lb (81.6 kg)  12/22/15 177 lb 12.8 oz (80.6 kg)  01/18/14 184 lb (83.5 kg)    Physical Exam  Constitutional: He is oriented to person, place, and time. He appears well-developed and well-nourished. No distress.  HENT:  Right Ear: External ear normal.  Left Ear: External ear normal.  Nose: Nose normal.  Mouth/Throat: Oropharynx is clear and moist. No oropharyngeal exudate.  Eyes: Pupils are equal, round, and reactive to light. Conjunctivae and EOM are normal. Right eye exhibits no discharge. No scleral icterus.  Neck: Neck supple. No thyromegaly present.  Cardiovascular: Normal rate, regular rhythm, normal heart sounds and intact distal pulses.  No murmur heard. Pulmonary/Chest: Effort normal and breath sounds normal. No respiratory distress. He has no wheezes.  Abdominal: Soft. Bowel sounds are normal. He exhibits no distension. There is no tenderness. There is no rebound and no guarding.  Musculoskeletal: Normal range of motion. He exhibits no edema.  Lymphadenopathy:    He has no cervical adenopathy.  Neurological: He is  alert and oriented to person, place, and time. Coordination normal.  Skin: Skin is warm and dry. No rash noted. He is not diaphoretic.  Psychiatric: He has a normal mood and affect. His behavior is normal.  Vitals reviewed.      Assessment & Plan:   Problem List Items Addressed This Visit      Cardiovascular and Mediastinum   Hypertension     Digestive   GERD (gastroesophageal reflux disease)     Other   Annual physical exam - Primary   Relevant Orders   CBC with Differential/Platelet (Completed)   Lipid panel (Completed)   CMP14+EGFR  (Completed)   PSA, total and free (Completed)   Ambulatory referral to Gastroenterology    Other Visit Diagnoses    Colon cancer screening       Relevant Orders   Ambulatory referral to Gastroenterology       Follow up plan: Return in about 1 year (around 03/31/2018), or if symptoms worsen or fail to improve.  Counseling provided for all of the vaccine components Orders Placed This Encounter  Procedures  . CBC with Differential/Platelet  . Lipid panel  . CMP14+EGFR  . PSA, total and free  . Ambulatory referral to Gastroenterology    Caryl Pina, MD [facility] 03/30/2017, 10:49 AM

## 2017-03-31 LAB — CBC WITH DIFFERENTIAL/PLATELET
BASOS ABS: 0.1 10*3/uL (ref 0.0–0.2)
Basos: 1 %
EOS (ABSOLUTE): 0.4 10*3/uL (ref 0.0–0.4)
Eos: 5 %
HEMOGLOBIN: 15.6 g/dL (ref 13.0–17.7)
Hematocrit: 45.1 % (ref 37.5–51.0)
IMMATURE GRANS (ABS): 0 10*3/uL (ref 0.0–0.1)
IMMATURE GRANULOCYTES: 1 %
LYMPHS: 15 %
Lymphocytes Absolute: 1.3 10*3/uL (ref 0.7–3.1)
MCH: 32.4 pg (ref 26.6–33.0)
MCHC: 34.6 g/dL (ref 31.5–35.7)
MCV: 94 fL (ref 79–97)
MONOCYTES: 10 %
Monocytes Absolute: 0.9 10*3/uL (ref 0.1–0.9)
NEUTROS ABS: 6 10*3/uL (ref 1.4–7.0)
NEUTROS PCT: 68 %
Platelets: 246 10*3/uL (ref 150–379)
RBC: 4.82 x10E6/uL (ref 4.14–5.80)
RDW: 13.4 % (ref 12.3–15.4)
WBC: 8.7 10*3/uL (ref 3.4–10.8)

## 2017-03-31 LAB — LIPID PANEL
CHOLESTEROL TOTAL: 152 mg/dL (ref 100–199)
Chol/HDL Ratio: 3.4 ratio (ref 0.0–5.0)
HDL: 45 mg/dL (ref >39)
LDL CALC: 81 mg/dL (ref 0–99)
TRIGLYCERIDES: 130 mg/dL (ref 0–149)
VLDL CHOLESTEROL CAL: 26 mg/dL (ref 5–40)

## 2017-03-31 LAB — CMP14+EGFR
ALBUMIN: 4.3 g/dL (ref 3.5–5.5)
ALK PHOS: 79 IU/L (ref 39–117)
ALT: 25 IU/L (ref 0–44)
AST: 24 IU/L (ref 0–40)
Albumin/Globulin Ratio: 1.9 (ref 1.2–2.2)
BILIRUBIN TOTAL: 0.5 mg/dL (ref 0.0–1.2)
BUN / CREAT RATIO: 16 (ref 9–20)
BUN: 18 mg/dL (ref 6–24)
CHLORIDE: 104 mmol/L (ref 96–106)
CO2: 24 mmol/L (ref 20–29)
CREATININE: 1.11 mg/dL (ref 0.76–1.27)
Calcium: 9.2 mg/dL (ref 8.7–10.2)
GFR calc non Af Amer: 73 mL/min/{1.73_m2} (ref >59)
GFR, EST AFRICAN AMERICAN: 85 mL/min/{1.73_m2} (ref >59)
GLOBULIN, TOTAL: 2.3 g/dL (ref 1.5–4.5)
Glucose: 94 mg/dL (ref 65–99)
Potassium: 4.4 mmol/L (ref 3.5–5.2)
SODIUM: 139 mmol/L (ref 134–144)
TOTAL PROTEIN: 6.6 g/dL (ref 6.0–8.5)

## 2017-03-31 LAB — PSA, TOTAL AND FREE
PROSTATE SPECIFIC AG, SERUM: 1.4 ng/mL (ref 0.0–4.0)
PSA, Free Pct: 35.7 %
PSA, Free: 0.5 ng/mL

## 2017-04-02 DIAGNOSIS — H04123 Dry eye syndrome of bilateral lacrimal glands: Secondary | ICD-10-CM | POA: Diagnosis not present

## 2017-04-21 ENCOUNTER — Ambulatory Visit (AMBULATORY_SURGERY_CENTER): Payer: Self-pay

## 2017-04-21 ENCOUNTER — Encounter: Payer: Self-pay | Admitting: Gastroenterology

## 2017-04-21 VITALS — Ht 70.5 in | Wt 181.6 lb

## 2017-04-21 DIAGNOSIS — Z1211 Encounter for screening for malignant neoplasm of colon: Secondary | ICD-10-CM

## 2017-04-21 MED ORDER — NA SULFATE-K SULFATE-MG SULF 17.5-3.13-1.6 GM/177ML PO SOLN: 1.0000 | Freq: Once | ORAL | 0 refills | Status: AC

## 2017-04-21 NOTE — Progress Notes (Signed)
Per pt, no allergies to soy or egg products.Pt not taking any weight loss meds or using  O2 at home.  Pt refused emmi video. 

## 2017-04-29 DIAGNOSIS — H04123 Dry eye syndrome of bilateral lacrimal glands: Secondary | ICD-10-CM | POA: Diagnosis not present

## 2017-05-06 ENCOUNTER — Ambulatory Visit (AMBULATORY_SURGERY_CENTER): Payer: Federal, State, Local not specified - PPO | Admitting: Gastroenterology

## 2017-05-06 ENCOUNTER — Encounter: Payer: Self-pay | Admitting: Gastroenterology

## 2017-05-06 ENCOUNTER — Other Ambulatory Visit: Payer: Self-pay

## 2017-05-06 VITALS — BP 135/71 | HR 82 | Temp 97.0°F | Resp 11 | Ht 70.0 in | Wt 181.0 lb

## 2017-05-06 DIAGNOSIS — Z86010 Personal history of colonic polyps: Secondary | ICD-10-CM

## 2017-05-06 DIAGNOSIS — Z1212 Encounter for screening for malignant neoplasm of rectum: Secondary | ICD-10-CM

## 2017-05-06 DIAGNOSIS — Z1211 Encounter for screening for malignant neoplasm of colon: Secondary | ICD-10-CM | POA: Diagnosis present

## 2017-05-06 MED ORDER — SODIUM CHLORIDE 0.9 % IV SOLN: 500.0000 mL | Freq: Once | INTRAVENOUS | Status: AC

## 2017-05-06 NOTE — Progress Notes (Signed)
Pt's states no medical or surgical changes since previsit or office visit. 

## 2017-05-06 NOTE — Op Note (Addendum)
[facility] Patient Name: Scott James Procedure Date: 05/06/2017 10:35 AM MRN: [id number] Endoscopist: Viviann Spare P. Jb Dulworth MD, MD Age: 57 Referring MD:  Date of Birth: [birth year] Gender: Male Account #: [id number] Procedure:                Colonoscopy Indications:              High risk colon cancer surveillance: Personal                            history of colonic polyps - reportedly "large                            polyp" removed 10 years ago by [facility], has not                            had a follow up since that time (overdue) Medicines:                Monitored Anesthesia Care Procedure:                Pre-Anesthesia Assessment:                           - Prior to the procedure, a History and Physical                            was performed, and patient medications and                            allergies were reviewed. The patient's tolerance of                            previous anesthesia was also reviewed. The risks                            and benefits of the procedure and the sedation                            options and risks were discussed with the patient.                            All questions were answered, and informed consent                            was obtained. Prior Anticoagulants: The patient has                            taken no previous anticoagulant or antiplatelet                            agents. ASA Grade Assessment: II - A patient with                            mild systemic disease. After reviewing the risks  and benefits, the patient was deemed in                            satisfactory condition to undergo the procedure.                           After obtaining informed consent, the colonoscope                            was passed under direct vision. Throughout the                            procedure, the patient's blood pressure, pulse, and                            oxygen saturations were  monitored continuously. The                            Colonoscope was introduced through the anus and                            advanced to the the cecum, identified by                            appendiceal orifice and ileocecal valve. The                            colonoscopy was performed without difficulty. The                            patient tolerated the procedure well. The quality                            of the bowel preparation was good. The ileocecal                            valve, appendiceal orifice, and rectum were                            photographed. Scope In: 10:37:03 AM Scope Out: 10:52:14 AM Scope Withdrawal Time: 0 hours 13 minutes 9 seconds  Total Procedure Duration: 0 hours 15 minutes 11 seconds  Findings:                 The perianal and digital rectal examinations were                            normal.                           A few small-mouthed diverticula were found in the                            sigmoid colon.  Anal papilla(e) were hypertrophied.                           The exam was otherwise without abnormality. Complications:            No immediate complications. Estimated blood loss:                            None. Estimated Blood Loss:     Estimated blood loss: none. Impression:               - Diverticulosis in the sigmoid colon.                           - Anal papilla(e) were hypertrophied.                           - The examination was otherwise normal.                           - No specimens collected. Recommendation:           - Patient has a contact number available for                            emergencies. The signs and symptoms of potential                            delayed complications were discussed with the                            patient. Return to normal activities tomorrow.                            Written discharge instructions were provided to the                             patient.                           - Resume previous diet.                           - Continue present medications.                           - Will obtain report from last colonoscopy to                            clarify when he is next due (if he had a large                            adenoma, may consider a 5 year follow up) Viviann Spare P. Ramata Strothman MD, MD 05/06/2017 10:54:54 AM This report has been signed electronically.

## 2017-05-06 NOTE — Patient Instructions (Signed)
YOU HAD AN ENDOSCOPIC PROCEDURE TODAY AT THE [facility]:   Refer to the procedure report that was given to you for any specific questions about what was found during the examination.  If the procedure report does not answer your questions, please call your gastroenterologist to clarify.  If you requested that your care partner not be given the details of your procedure findings, then the procedure report has been included in a sealed envelope for you to review at your convenience later.  YOU SHOULD EXPECT: Some feelings of bloating in the abdomen. Passage of more gas than usual.  Walking can help get rid of the air that was put into your GI tract during the procedure and reduce the bloating. If you had a lower endoscopy (such as a colonoscopy or flexible sigmoidoscopy) you may notice spotting of blood in your stool or on the toilet paper. If you underwent a bowel prep for your procedure, you may not have a normal bowel movement for a few days.  Please Note:  You might notice some irritation and congestion in your nose or some drainage.  This is from the oxygen used during your procedure.  There is no need for concern and it should clear up in a day or so.  SYMPTOMS TO REPORT IMMEDIATELY:   Following lower endoscopy (colonoscopy or flexible sigmoidoscopy):  Excessive amounts of blood in the stool  Significant tenderness or worsening of abdominal pains  Swelling of the abdomen that is new, acute  Fever of 100F or higher  For urgent or emergent issues, a gastroenterologist can be reached at any hour by calling [phone].   DIET:  We do recommend a small meal at first, but then you may proceed to your regular diet.  Drink plenty of fluids but you should avoid alcoholic beverages for 24 hours.  ACTIVITY:  You should plan to take it easy for the rest of today and you should NOT DRIVE or use heavy machinery until tomorrow (because of the sedation medicines used during the test).     FOLLOW UP: Our staff will call the number listed on your records the next business day following your procedure to check on you and address any questions or concerns that you may have regarding the information given to you following your procedure. If we do not reach you, we will leave a message.  However, if you are feeling well and you are not experiencing any problems, there is no need to return our call.  We will assume that you have returned to your regular daily activities without incident.  If any biopsies were taken you will be contacted by phone or by letter within the next 1-3 weeks.  Please call us at [phone] if you have not heard about the biopsies in 3 weeks.    SIGNATURES/CONFIDENTIALITY: You and/or your care partner have signed paperwork which will be entered into your electronic medical record.  These signatures attest to the fact that that the information above on your After Visit Summary has been reviewed and is understood.  Full responsibility of the confidentiality of this discharge information lies with you and/or your care-partner. 

## 2017-05-06 NOTE — Progress Notes (Signed)
A and O x3. Report to RN. Tolerated MAC anesthesia well.

## 2017-05-09 ENCOUNTER — Telehealth: Payer: Self-pay

## 2017-05-09 ENCOUNTER — Telehealth: Payer: Self-pay | Admitting: *Deleted

## 2017-05-09 NOTE — Telephone Encounter (Signed)
NO ANSWER, MESSAGE LEFT FOR PATIENT. 

## 2017-05-09 NOTE — Telephone Encounter (Signed)
  Follow up Call-  Call back number 05/06/2017  Post procedure Call Back phone  # [phone]  Permission to leave phone message Yes  Some recent data might be hidden     Patient questions:  Do you have a fever, pain , or abdominal swelling? No. Pain Score  0 *  Have you tolerated food without any problems? Yes.    Have you been able to return to your normal activities? Yes.    Do you have any questions about your discharge instructions: Diet   No. Medications  No. Follow up visit  No.  Do you have questions or concerns about your Care? No.  Actions: * If pain score is 4 or above: No action needed, pain <4.

## 2017-05-09 NOTE — Telephone Encounter (Signed)
Benancio Deeds, MD  Cooper Render, CMA        Hi Jan,  I'm trying to track down this patient's last colonoscopy report, if you could help. I'll touch base this upcoming week about it. Thanks   Faxed a release to [facility] to see if we could get a copy of the last colonoscopy report.

## 2017-05-11 NOTE — Telephone Encounter (Signed)
Called [facility]. They will fax report to [place] today.

## 2017-05-11 NOTE — Telephone Encounter (Signed)
Records rec'd and placed on Dr. Roberts Gaudy desk.

## 2017-05-12 ENCOUNTER — Telehealth: Payer: Self-pay | Admitting: Gastroenterology

## 2017-05-12 NOTE — Telephone Encounter (Signed)
Patient's records received:  Colonoscopy 08/29/2006 - Dr. Bosie Clos - 5cm sigmoid colon polyp c/w tubulovillous adenoma.  His most recent colonoscopy with me was normal. Given size and type of polyp on his last colonoscopy, recommend a repeat surveillance colonoscopy in 5 years from now.  Jan can you please enter recall colonoscopy for 5 years and let the patient know? Thanks much

## 2017-05-12 NOTE — Telephone Encounter (Signed)
LM for pt to call back.

## 2017-05-13 ENCOUNTER — Telehealth: Payer: Self-pay

## 2017-05-13 NOTE — Telephone Encounter (Signed)
Called and spoke to pt.  Relayed results and recommendations.  He expressed understanding.  He noted that he hated the taste of Suprep and would like to try something else next time. Recall placed for 5 years. 05-05-22.    Dr Adela Lank: Patient's records received:  Colonoscopy 08/29/2006 - Dr. Bosie Clos - 5cm sigmoid colon polyp c/w tubulovillous adenoma.  His most recent colonoscopy with me was normal. Given size and type of polyp on his last colonoscopy, recommend a repeat surveillance colonoscopy in 5 years from now.  Jan can you please enter recall colonoscopy for 5 years and let the patient know? Thanks much

## 2017-05-14 DIAGNOSIS — G44219 Episodic tension-type headache, not intractable: Secondary | ICD-10-CM | POA: Diagnosis not present

## 2017-05-31 ENCOUNTER — Ambulatory Visit (INDEPENDENT_AMBULATORY_CARE_PROVIDER_SITE_OTHER): Payer: Federal, State, Local not specified - PPO

## 2017-05-31 ENCOUNTER — Encounter: Payer: Self-pay | Admitting: Family Medicine

## 2017-05-31 ENCOUNTER — Ambulatory Visit: Payer: Federal, State, Local not specified - PPO | Admitting: Family Medicine

## 2017-05-31 VITALS — BP 138/87 | HR 82 | Temp 97.5°F | Ht 70.0 in | Wt 180.0 lb

## 2017-05-31 DIAGNOSIS — M25511 Pain in right shoulder: Secondary | ICD-10-CM | POA: Diagnosis not present

## 2017-05-31 MED ORDER — BETAMETHASONE SOD PHOS & ACET 6 (3-3) MG/ML IJ SUSP
6.0000 mg | Freq: Once | INTRAMUSCULAR | Status: AC
  Administered 2017-05-31: 6 mg via INTRAMUSCULAR

## 2017-05-31 MED ORDER — DICLOFENAC SODIUM 75 MG PO TBEC: 75.0000 mg | DELAYED_RELEASE_TABLET | Freq: Two times a day (BID) | ORAL | 0 refills | Status: AC

## 2017-05-31 NOTE — Progress Notes (Signed)
Chief Complaint  Patient presents with  . Shoulder Pain    pt here today c/o right shoulder pain    HPI  Patient presents today for onset yesterday of right shoulder pain.  He threw a bag of trash using and under arm maneuver.  He says it weighed less than 10 pounds.  He did not use excessive force.  However he instantly felt excruciating pain in the right shoulder points to the anterior deltoid region.  Now it hurts for all range of motion.  He has a dull ache when he is sitting still of 2-3/10.  However with any range of motion the pain instantly goes up to 7-8/10.  It has been that way ever since yesterday when this incident occurred.  Of note is that he first experienced shoulder pain 2 years ago that resolved with some time.  And had another flare about a year ago.  Both were similar to this episode but resolved spontaneously and were not as severe.  There was no known injury in any case.  He has no history of shoulder injury previously.  PMH: Smoking status noted ROS: Per HPI  Objective: BP 138/87   Pulse 82   Temp (!) 97.5 F (36.4 C) (Oral)   Ht  (1.778 m)   Wt 180 lb (81.6 kg)   BMI 25.83 kg/m  Gen: NAD, alert, cooperative with exam HEENT: NCAT, EOMI, PERRL Ext: No edema, warm.  There is guarding for all range of motion.  I was able to get him to move about 10 degrees for abduction for the right upper extremity.  Similar range of motion for flexion.  There was marked tenderness for rotation but he was able to go 45 degrees.  There was tenderness at the posterior axillary line at the margin of the scapula with abduction Neuro: Alert and oriented, No gross deficits  Assessment and plan:  1. Right shoulder pain, unspecified chronicity     Meds ordered this encounter  Medications  . betamethasone acetate-betamethasone sodium phosphate (CELESTONE) injection 6 mg  . diclofenac (VOLTAREN) 75 MG EC tablet    Sig: Take 1 tablet (75 mg total) by mouth 2 (two) times daily.   Dispense:  60 tablet    Refill:  0    Orders Placed This Encounter  Procedures  . DG Shoulder Right    Standing Status:   Future    Number of Occurrences:   1    Standing Expiration Date:   07/31/2018    Order Specific Question:   Reason for Exam (SYMPTOM  OR DIAGNOSIS REQUIRED)    Answer:   shoulder pain    Order Specific Question:   Preferred imaging location?    Answer:   Internal  . Ambulatory referral to Physical Therapy    Referral Priority:   Routine    Referral Type:   Physical Medicine    Referral Reason:   Specialty Services Required    Requested Specialty:   Physical Therapy    Number of Visits Requested:   1    Follow up 2 weeks.  Shoulder sling and swath dispensed patient should wear it at all times.  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  He can try to drive that as safely as possible should he not be able to comfortably shift and he may need to have someone drive him or just stay home.  He mentioned having to use a computer at work.  If he cannot work around that he may need to be off.  He should apply ice 4 times a day.  Physical therapy ordered as above.  Range of motion exercises 4 times a day until physical therapy can be arranged. Mechele Claude, MD

## 2017-06-06 DIAGNOSIS — M19011 Primary osteoarthritis, right shoulder: Secondary | ICD-10-CM | POA: Diagnosis not present

## 2017-06-08 DIAGNOSIS — M25511 Pain in right shoulder: Secondary | ICD-10-CM | POA: Diagnosis not present

## 2017-06-08 DIAGNOSIS — I10 Essential (primary) hypertension: Secondary | ICD-10-CM | POA: Diagnosis not present

## 2017-06-09 ENCOUNTER — Ambulatory Visit: Payer: Federal, State, Local not specified - PPO | Admitting: Physical Therapy

## 2017-06-21 ENCOUNTER — Ambulatory Visit: Payer: Federal, State, Local not specified - PPO | Attending: Family Medicine | Admitting: Physical Therapy

## 2017-06-21 ENCOUNTER — Encounter: Payer: Self-pay | Admitting: Physical Therapy

## 2017-06-21 DIAGNOSIS — M25611 Stiffness of right shoulder, not elsewhere classified: Secondary | ICD-10-CM | POA: Diagnosis not present

## 2017-06-21 DIAGNOSIS — M25511 Pain in right shoulder: Secondary | ICD-10-CM

## 2017-06-21 NOTE — Therapy (Signed)
[name] [address] [place], [place], [address] Phone: [phone]   Fax:  [phone]  Physical Therapy Treatment  Patient Details  Name: Scott James MRN: [id number] Date of Birth: 26-Nov-1960 Referring Provider: Mechele Claude   Encounter Date: 06/21/2017  PT End of Session - 06/21/17 2018    Visit Number  1    Number of Visits  12    Date for PT Re-Evaluation  08/02/17    Authorization Type  Progress note every 10th visit    PT Start Time  1430    PT Stop Time  1513    PT Time Calculation (min)  43 min    Activity Tolerance  Patient tolerated treatment well    Behavior During Therapy  [facility] for tasks assessed/performed       Past Medical History:  Diagnosis Date  . Allergy    seasonal  . Arthritis    rt ankle  . Bulging disc    L4-5/ no surgery  . GERD (gastroesophageal reflux disease)   . Hypertension    on meds twice in the past    Past Surgical History:  Procedure Laterality Date  . ANKLE FRACTURE SURGERY Right 1988   shatterred talleous   . CHOLECYSTECTOMY     3/32015  . HAND SURGERY     left hand  . JOINT REPLACEMENT     rt knee arthroscopy  . KNEE ARTHROSCOPY Left 2018   torn cartilage  . tonsillectomy      There were no vitals filed for this visit.  Subjective Assessment - 06/21/17 2001    Subjective  Patient arrives to physical therapy with reports of global right shoulder pain after throwing a bag of garbage into his truck on 05/30/17. Patient reported he has had ongoing shoulder pain but when he released the bag, he felt a sharp pulling 10/10 pain in his right shoulder. Patient was provided with a sling to wear for pain. Patient reports pain with all movement especially with abduction. Patient reports pain at best is a 2/10 with rest and ice. Patient reports difficulties with ADLs and removing shirt. Patient's goals are to improve use of R arm and return to PLOF.    Limitations  Lifting;House hold  activities    Diagnostic tests  MRI on July 05, 2017    Patient Stated Goals  get back to normal and use arm again    Currently in Pain?  Yes    Pain Score  2     Pain Location  Shoulder    Pain Orientation  Posterior;Anterior;Right;Lateral    Pain Descriptors / Indicators  Sore;Dull;Aching    Pain Type  Acute pain    Pain Onset  1 to 4 weeks ago    Pain Frequency  Intermittent    Aggravating Factors   increased activity    Pain Relieving Factors  ice         [facility] PT Assessment - 06/21/17 0001      Assessment   Medical Diagnosis  Right shoulder pain    Referring Provider  Broadus John Stacks    Onset Date/Surgical Date  05/30/17    Hand Dominance  Right    Prior Therapy  no      Balance Screen   Has the patient fallen in the past 6 months  No    Has the patient had a decrease in activity level because of a fear of falling?   No    Is the  patient reluctant to leave their home because of a fear of falling?   No      Home Public house manager residence      Prior Function   Level of Independence  Independent      Sensation   Light Touch  Appears Intact      ROM / Strength   AROM / PROM / Strength  AROM;PROM;Strength      AROM   Overall AROM   Deficits    AROM Assessment Site  Shoulder    Right/Left Shoulder  Right    Right Shoulder Flexion  80 Degrees    Right Shoulder ABduction  82 Degrees    Right Shoulder External Rotation  35 Degrees      Strength   Overall Strength  Deficits    Strength Assessment Site  Shoulder    Right/Left Shoulder  Right    Right Shoulder Flexion  3-/5    Right Shoulder ABduction  3-/5    Right Shoulder Internal Rotation  4+/5    Right Shoulder External Rotation  3/5      Palpation   Palpation comment  Very tender to palpation along right coracoid process, bicipital tendon, biceps, middle deltoid, posterior axilla and UT      Special Tests    Special Tests  Biceps/Labral Tests;Rotator Cuff Impingement    Rotator  Cuff Impingment tests  Painful Arc of Motion;Drop Arm test;Hawkins- Kennedy test    Biceps/Labral tests  Speeds Test      Hawkins-Kennedy test   Findings  Positive    Side  Right    Comments  reproduction of symptoms      Drop Arm test   Findings  Negative    Side  Right      Painful Arc of Motion   Findings  Positive    Side  Right    Comments  pain at 120degrees and 80 degrees of abduction      Speeds test   findings  Positive    Side  Right                   [facility] Adult PT Treatment/Exercise - 06/21/17 0001      Modalities   Modalities  Electrical Stimulation;Vasopneumatic      Electrical Stimulation   Electrical Stimulation Location  Right shoulder    Electrical Stimulation Action  IFC    Electrical Stimulation Parameters  80-150 hz x15 min    Electrical Stimulation Goals  Pain      Vasopneumatic   Number Minutes Vasopneumatic   15 minutes    Vasopnuematic Location   Shoulder    Vasopneumatic Pressure  Low    Vasopneumatic Temperature   36                  PT Long Term Goals - 06/21/17 2023      PT LONG TERM GOAL #1   Title  Patient will be independent with HEP    Time  6    Period  Weeks    Status  New      PT LONG TERM GOAL #2   Title  Patient will improve right shoulder AROM flexion and abduction to 140+ degrees for functional tasks    Time  6    Period  Weeks    Status  New      PT LONG TERM GOAL #3   Title  Patient will improve right  shoulder strength to 5/5 in all planes to improve stability during functional tasks    Time  6    Period  Weeks    Status  New      PT LONG TERM GOAL #4   Title  Patient will report ability to perform ADLs with shoulder pain less than or equal 3/10.    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    Time  6    Period  Weeks    Status  New             Plan - 06/21/17 2020    Clinical Impression Statement  Patient is a right handed 57 year old male who presents to physical therapy with global right shoulder pain, decreased AROM and PROM, and decreased right shoulder strength. Patient noted with tenderness to palpation throughout shoulder. (+) hawkin's kennedy, (+) arc of motion and (+) speed's test. Patient would benefit from skilled physical therapy to address deficits and to address patient's goals.    Clinical Presentation  Evolving    Clinical Presentation due to:  no improvement of pain    Clinical Decision Making  Low    Rehab Potential  Good    PT Frequency  2x / week    PT Duration  6 weeks    PT Treatment/Interventions  Iontophoresis 4mg /ml Dexamethasone;ADLs/Self Care Home Management;Neuromuscular re-education;Patient/family education;Manual techniques;Passive range of motion;Dry needling;Taping;Vasopneumatic Device;Electrical Stimulation;Cryotherapy;Moist Heat;Ultrasound;Therapeutic exercise;Therapeutic activities    PT Next Visit Plan  Gentle AAROM, PROM of right shoulder scapular strengthening per tolerance, modalties PRN for pain relief    PT Home Exercise Plan  pendulums, AAROM flexion, scapular retractions    Consulted and Agree with Plan of Care  Patient       Patient will benefit from skilled therapeutic intervention in order to improve the following deficits and impairments:  Pain, Postural dysfunction, Impaired UE functional use, Decreased range of motion, Decreased endurance, Decreased activity tolerance, Decreased strength  Visit Diagnosis: Acute pain of right shoulder  Stiffness of right shoulder, not elsewhere classified     Problem List Patient Active Problem List   Diagnosis Date Noted  . Chronic cholecystitis with calculus 02/13/2013  . Annual physical exam 02/06/2013  . Hypertension   . GERD (gastroesophageal reflux disease)   . Arthritis   . Bulging disc    Guss BundeKrystle Sarath Privott, PT,  DPT 06/21/2017, 8:30 PM  [name] [address] [place], [place], [address] Phone: [phone]   Fax:  [phone]  Name: Scott James MRN: [id number] Date of Birth: 10/26/1960

## 2017-06-28 ENCOUNTER — Ambulatory Visit: Payer: Federal, State, Local not specified - PPO | Admitting: Physical Therapy

## 2017-06-28 DIAGNOSIS — M25611 Stiffness of right shoulder, not elsewhere classified: Secondary | ICD-10-CM | POA: Diagnosis not present

## 2017-06-28 DIAGNOSIS — M25511 Pain in right shoulder: Secondary | ICD-10-CM | POA: Diagnosis not present

## 2017-06-28 NOTE — Therapy (Addendum)
[name] [place], [place], [address] Phone: [phone]   Fax:  [phone]  Physical Therapy Treatment/Discharge  Patient Details  Name: Scott James MRN: [id number] Date of Birth: 11-22-1960 Referring Provider: Claretta Fraise   Encounter Date: 06/28/2017  PT End of Session - 06/28/17 1604    Visit Number  2    Number of Visits  12    Date for PT Re-Evaluation  08/02/17    Authorization Type  Progress note every 10th visit    PT Start Time  1600    PT Stop Time  1658    PT Time Calculation (min)  58 min    Activity Tolerance  Patient tolerated treatment well    Behavior During Therapy  [facility] for tasks assessed/performed       Past Medical History:  Diagnosis Date  . Allergy    seasonal  . Arthritis    rt ankle  . Bulging disc    L4-5/ no surgery  . GERD (gastroesophageal reflux disease)   . Hypertension    on meds twice in the past    Past Surgical History:  Procedure Laterality Date  . ANKLE FRACTURE SURGERY Right 1988   shatterred talleous   . CHOLECYSTECTOMY     3/32015  . HAND SURGERY     left hand  . JOINT REPLACEMENT     rt knee arthroscopy  . KNEE ARTHROSCOPY Left 2018   torn cartilage  . tonsillectomy      There were no vitals filed for this visit.  Subjective Assessment - 06/28/17 1603    Subjective  Patient reports feeling sore in the right shoulder today. Reports intermittent pain with certain movments. Patient to have MRI on July 05, 2017.    Limitations  Lifting;House hold activities    Diagnostic tests  MRI on July 05, 2017    Patient Stated Goals  get back to normal and use arm again    Currently in Pain?  Yes did not provide pain scale         [facility] PT Assessment - 06/28/17 0001      Assessment   Medical Diagnosis  Right shoulder pain    Hand Dominance  Right    Prior Therapy  no                   [facility] Adult PT Treatment/Exercise - 06/28/17 0001       Exercises   Exercises  Shoulder      Shoulder Exercises: Supine   Flexion  AAROM;Both;10 reps with PVC      Shoulder Exercises: Pulleys   Flexion  3 minutes      Modalities   Modalities  Electrical Stimulation;Cryotherapy      Cryotherapy   Number Minutes Cryotherapy  15 Minutes    Cryotherapy Location  Shoulder    Type of Cryotherapy  Ice pack      Electrical Stimulation   Electrical Stimulation Location  Right shoulder    Electrical Stimulation Action  IFC    Electrical Stimulation Parameters  80-150 hz x15 min    Electrical Stimulation Goals  Pain      Manual Therapy   Manual Therapy  Soft tissue mobilization;Passive ROM    Soft tissue mobilization  STW to bicep and deltoid muscle to reduce pain, trigger point release to medial border of right scapula to reduce trigger point    Passive ROM  Gentle PROM into flexion and ER to  improve ROM                  PT Long Term Goals - 06/21/17 2023      PT LONG TERM GOAL #1   Title  Patient will be independent with HEP    Time  6    Period  Weeks    Status  New      PT LONG TERM GOAL #2   Title  Patient will improve right shoulder AROM flexion and abduction to 140+ degrees for functional tasks    Time  6    Period  Weeks    Status  New      PT LONG TERM GOAL #3   Title  Patient will improve right shoulder strength to 5/5 in all planes to improve stability during functional tasks    Time  6    Period  Weeks    Status  New      PT LONG TERM GOAL #4   Title  Patient will report ability to perform ADLs with shoulder pain less than or equal 3/10.    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    Time  6    Period  Weeks    Status  New            Plan - 06/28/17 1741    Clinical Impression Statement  Patient was able to tolerate treatment well with some reports of  sharp pains with eccentric descent of R arm. Patient noted wiht increased muscle tension in the biceps muscle as well as multiple trigger points along medial border of the right scapula. Patient noted with a decrease in pain after STW/M and trigger point release. Vaso not available today, normal response to e-stim at end of session.     Clinical Presentation  Evolving    Clinical Decision Making  Low    Rehab Potential  Good    PT Frequency  2x / week    PT Duration  6 weeks    PT Treatment/Interventions  Iontophoresis 39m/ml Dexamethasone;ADLs/Self Care Home Management;Neuromuscular re-education;Patient/family education;Manual techniques;Passive range of motion;Dry needling;Taping;Vasopneumatic Device;Electrical Stimulation;Cryotherapy;Moist Heat;Ultrasound;Therapeutic exercise;Therapeutic activities    PT Next Visit Plan  Gentle AAROM, PROM of right shoulder scapular strengthening per tolerance, STW/M, ultrasound/combo; modalties PRN for pain relief    PT Home Exercise Plan  pendulums, AAROM flexion, scapular retractions    Consulted and Agree with Plan of Care  Patient       Patient will benefit from skilled therapeutic intervention in order to improve the following deficits and impairments:  Pain, Postural dysfunction, Impaired UE functional use, Decreased range of motion, Decreased endurance, Decreased activity tolerance, Decreased strength  Visit Diagnosis: Acute pain of right shoulder  Stiffness of right shoulder, not elsewhere classified     Problem List Patient Active Problem List   Diagnosis Date Noted  . Chronic cholecystitis with calculus 02/13/2013  . Annual physical exam 02/06/2013  . Hypertension   . GERD (gastroesophageal reflux disease)   . Arthritis   . Bulging disc     PHYSICAL THERAPY DISCHARGE SUMMARY  Visits from Start of Care: 2  Current functional level related to goals / functional outcomes: See above   Remaining deficits: Goals not met  Education  / Equipment: HEP Plan: Patient agrees to discharge.  Patient goals were not met. Patient is being discharged due to not returning since the last visit.  ?????      Gabriela Eves, PT, DPT 06/28/2017, 5:54 PM  [facility] [address] [place], [place], [address] Phone: [phone]   Fax:  [phone]  Name: Dontravious Camille MRN: [id number] Date of Birth: April 13, 1960

## 2017-07-05 DIAGNOSIS — M25411 Effusion, right shoulder: Secondary | ICD-10-CM | POA: Diagnosis not present

## 2017-07-05 DIAGNOSIS — S46011A Strain of muscle(s) and tendon(s) of the rotator cuff of right shoulder, initial encounter: Secondary | ICD-10-CM | POA: Diagnosis not present

## 2017-07-05 DIAGNOSIS — X509XXA Other and unspecified overexertion or strenuous movements or postures, initial encounter: Secondary | ICD-10-CM | POA: Diagnosis not present

## 2017-07-05 DIAGNOSIS — M19011 Primary osteoarthritis, right shoulder: Secondary | ICD-10-CM | POA: Diagnosis not present

## 2017-07-06 ENCOUNTER — Encounter: Payer: Federal, State, Local not specified - PPO | Admitting: Physical Therapy

## 2017-07-12 ENCOUNTER — Ambulatory Visit: Payer: Federal, State, Local not specified - PPO | Admitting: Physical Therapy

## 2017-08-11 DIAGNOSIS — M25511 Pain in right shoulder: Secondary | ICD-10-CM | POA: Diagnosis not present

## 2017-09-02 DIAGNOSIS — G8918 Other acute postprocedural pain: Secondary | ICD-10-CM | POA: Diagnosis not present

## 2017-09-02 DIAGNOSIS — M75121 Complete rotator cuff tear or rupture of right shoulder, not specified as traumatic: Secondary | ICD-10-CM | POA: Diagnosis not present

## 2017-09-09 DIAGNOSIS — M25511 Pain in right shoulder: Secondary | ICD-10-CM | POA: Diagnosis not present

## 2017-09-19 DIAGNOSIS — M25511 Pain in right shoulder: Secondary | ICD-10-CM | POA: Diagnosis not present

## 2017-09-27 DIAGNOSIS — M25511 Pain in right shoulder: Secondary | ICD-10-CM | POA: Diagnosis not present

## 2017-10-05 DIAGNOSIS — M25511 Pain in right shoulder: Secondary | ICD-10-CM | POA: Diagnosis not present

## 2017-10-11 DIAGNOSIS — M25511 Pain in right shoulder: Secondary | ICD-10-CM | POA: Diagnosis not present

## 2017-10-13 DIAGNOSIS — M25511 Pain in right shoulder: Secondary | ICD-10-CM | POA: Diagnosis not present

## 2017-10-18 DIAGNOSIS — M25511 Pain in right shoulder: Secondary | ICD-10-CM | POA: Diagnosis not present

## 2017-10-20 DIAGNOSIS — M25511 Pain in right shoulder: Secondary | ICD-10-CM | POA: Diagnosis not present

## 2017-10-21 DIAGNOSIS — Z23 Encounter for immunization: Secondary | ICD-10-CM | POA: Diagnosis not present

## 2017-10-21 DIAGNOSIS — I10 Essential (primary) hypertension: Secondary | ICD-10-CM | POA: Diagnosis not present

## 2017-10-21 DIAGNOSIS — M25511 Pain in right shoulder: Secondary | ICD-10-CM | POA: Diagnosis not present

## 2017-10-21 DIAGNOSIS — L570 Actinic keratosis: Secondary | ICD-10-CM | POA: Diagnosis not present

## 2017-10-25 DIAGNOSIS — M25511 Pain in right shoulder: Secondary | ICD-10-CM | POA: Diagnosis not present

## 2017-10-27 DIAGNOSIS — M25511 Pain in right shoulder: Secondary | ICD-10-CM | POA: Diagnosis not present

## 2017-11-01 DIAGNOSIS — M25511 Pain in right shoulder: Secondary | ICD-10-CM | POA: Diagnosis not present

## 2017-11-03 DIAGNOSIS — M25511 Pain in right shoulder: Secondary | ICD-10-CM | POA: Diagnosis not present

## 2017-11-08 DIAGNOSIS — M25511 Pain in right shoulder: Secondary | ICD-10-CM | POA: Diagnosis not present

## 2017-11-11 DIAGNOSIS — M25511 Pain in right shoulder: Secondary | ICD-10-CM | POA: Diagnosis not present

## 2017-11-15 DIAGNOSIS — M25511 Pain in right shoulder: Secondary | ICD-10-CM | POA: Diagnosis not present

## 2017-11-22 DIAGNOSIS — M25511 Pain in right shoulder: Secondary | ICD-10-CM | POA: Diagnosis not present

## 2017-11-24 DIAGNOSIS — M25511 Pain in right shoulder: Secondary | ICD-10-CM | POA: Diagnosis not present

## 2017-11-29 DIAGNOSIS — M25511 Pain in right shoulder: Secondary | ICD-10-CM | POA: Diagnosis not present

## 2017-12-05 DIAGNOSIS — L820 Inflamed seborrheic keratosis: Secondary | ICD-10-CM | POA: Diagnosis not present

## 2017-12-05 DIAGNOSIS — D225 Melanocytic nevi of trunk: Secondary | ICD-10-CM | POA: Diagnosis not present

## 2017-12-07 DIAGNOSIS — M25511 Pain in right shoulder: Secondary | ICD-10-CM | POA: Diagnosis not present

## 2017-12-15 DIAGNOSIS — M25511 Pain in right shoulder: Secondary | ICD-10-CM | POA: Diagnosis not present

## 2017-12-22 DIAGNOSIS — M25511 Pain in right shoulder: Secondary | ICD-10-CM | POA: Diagnosis not present

## 2017-12-29 DIAGNOSIS — M25511 Pain in right shoulder: Secondary | ICD-10-CM | POA: Diagnosis not present

## 2018-01-12 DIAGNOSIS — M25511 Pain in right shoulder: Secondary | ICD-10-CM | POA: Diagnosis not present

## 2018-01-19 DIAGNOSIS — M25511 Pain in right shoulder: Secondary | ICD-10-CM | POA: Diagnosis not present

## 2018-04-20 DIAGNOSIS — Z09 Encounter for follow-up examination after completed treatment for conditions other than malignant neoplasm: Secondary | ICD-10-CM | POA: Diagnosis not present

## 2018-06-09 DIAGNOSIS — H40033 Anatomical narrow angle, bilateral: Secondary | ICD-10-CM | POA: Diagnosis not present

## 2018-06-09 DIAGNOSIS — H1013 Acute atopic conjunctivitis, bilateral: Secondary | ICD-10-CM | POA: Diagnosis not present

## 2018-11-02 ENCOUNTER — Ambulatory Visit: Payer: Federal, State, Local not specified - PPO | Admitting: Nurse Practitioner

## 2018-11-02 ENCOUNTER — Encounter: Payer: Self-pay | Admitting: Nurse Practitioner

## 2018-11-02 ENCOUNTER — Ambulatory Visit (INDEPENDENT_AMBULATORY_CARE_PROVIDER_SITE_OTHER): Payer: Federal, State, Local not specified - PPO

## 2018-11-02 VITALS — BP 160/92 | HR 93 | Temp 98.2°F | Resp 20 | Ht 70.0 in | Wt 181.0 lb

## 2018-11-02 DIAGNOSIS — M1711 Unilateral primary osteoarthritis, right knee: Secondary | ICD-10-CM | POA: Diagnosis not present

## 2018-11-02 DIAGNOSIS — M25561 Pain in right knee: Secondary | ICD-10-CM

## 2018-11-02 MED ORDER — PREDNISONE 10 MG (21) PO TBPK: ORAL_TABLET | ORAL | 0 refills | Status: DC

## 2018-11-02 NOTE — Progress Notes (Signed)
   Subjective:    Patient ID: Scott James, male    DOB: 19-Dec-1960, 58 y.o.   MRN: [id number]   Chief Complaint: Knee Pain (Right knee. Felipa Evener it pop today and instant pain)   HPI Patient comes in today c/o right knee pain and swelling. He said he injured it trying to climb up in a chair. The injury occurred this morning. Felt a pop and now he can hardly walk on it.    Review of Systems  Constitutional: Negative for activity change and appetite change.  HENT: Negative.   Eyes: Negative for pain.  Respiratory: Negative for shortness of breath.   Cardiovascular: Negative for chest pain, palpitations and leg swelling.  Gastrointestinal: Negative for abdominal pain.  Endocrine: Negative for polydipsia.  Genitourinary: Negative.   Musculoskeletal: Positive for joint swelling (right knee).  Skin: Negative for rash.  Neurological: Negative for dizziness, weakness and headaches.  Hematological: Does not bruise/bleed easily.  Psychiatric/Behavioral: Negative.   All other systems reviewed and are negative.      Objective:   Physical Exam Vitals signs and nursing note reviewed.  Cardiovascular:     Rate and Rhythm: Normal rate and regular rhythm.     Heart sounds: Normal heart sounds.  Pulmonary:     Effort: Pulmonary effort is normal.  Musculoskeletal:     Comments: FROM of right knee with pain pn flexion and xtension No patella tenderness Mild effusion All ligamenys intact.  Neurological:     General: No focal deficit present.     Mental Status: He is alert and oriented to person, place, and time.  Psychiatric:        Mood and Affect: Mood normal.        Behavior: Behavior normal.    BP (!) 160/92   Pulse 93   Temp 98.2 F (36.8 C) (Temporal)   Resp 20   Ht 5\' 10"  (1.778 m)   Wt 181 lb (82.1 kg)   SpO2 95%   BMI 25.97 kg/m    Right knee xay- joint space narroeing laterally     Assessment & Plan:  Scott James in today with chief complaint of Knee Pain (Right  knee. Heard it pop today and instant pain)   1. Acute pain of right knee Rest Ice BID  Compression wrap Elevate  - DG Knee 1-2 Views Right; Future - predniSONE (STERAPRED UNI-PAK 21 TAB) 10 MG (21) TBPK tablet; As directed x 6 days  Dispense: 21 tablet; Refill: 0   Mary-Margaret Hassell Done, FNP

## 2018-11-02 NOTE — Patient Instructions (Signed)

## 2018-11-13 ENCOUNTER — Other Ambulatory Visit: Payer: Self-pay

## 2018-11-13 ENCOUNTER — Encounter: Payer: Self-pay | Admitting: Family Medicine

## 2018-11-13 ENCOUNTER — Ambulatory Visit: Payer: Federal, State, Local not specified - PPO | Admitting: Family Medicine

## 2018-11-13 VITALS — BP 149/87 | HR 94 | Temp 98.4°F | Ht 70.0 in | Wt 182.4 lb

## 2018-11-13 DIAGNOSIS — S39012A Strain of muscle, fascia and tendon of lower back, initial encounter: Secondary | ICD-10-CM

## 2018-11-13 MED ORDER — BETAMETHASONE SOD PHOS & ACET 6 (3-3) MG/ML IJ SUSP
6.0000 mg | Freq: Once | INTRAMUSCULAR | Status: AC
  Administered 2018-11-13: 6 mg via INTRAMUSCULAR

## 2018-11-13 MED ORDER — TIZANIDINE HCL 6 MG PO CAPS: 6.0000 mg | ORAL_CAPSULE | Freq: Three times a day (TID) | ORAL | 1 refills | Status: AC | PRN

## 2018-11-13 NOTE — Progress Notes (Signed)
Chief Complaint  Patient presents with  . Back Pain    Patient states it started thur after twisting back while changing cat litter box.    HPI  Patient presents today for 4 days of low back pain just left of midline at low lumbar area. Twisted as he threw kitty litter into the woods. Then felt no pain, but a little later in bed it started hurting. It has been increaasing since then. He now cannot bend over or turn to his side at all due to the pain. It is 7-8/10 dull ache.  PMH: Smoking status noted ROS: Per HPI  Objective: BP (!) 149/87   Pulse 94   Temp 98.4 F (36.9 C) (Temporal)   Ht 5\' 10"  (1.778 m)   Wt 182 lb 6.4 oz (82.7 kg)   SpO2 97%   BMI 26.17 kg/m  Gen: NAD, alert, cooperative with exam HEENT: NCAT, EOMI, PERRL CV: RRR, good S1/S2, no murmur Resp: CTABL, no wheezes, non-labored Ext: No edema, warm. There is palpable spasm L5 paraspinous mmusculature on the left. Exquisitely tender. Decreased spinal flexion - 10 degrees. Guarding, would not attempt rotation.  Neuro: Alert and oriented, No gross deficits  Assessment and plan:  1. Strain of lumbar region, initial encounter     Meds ordered this encounter  Medications  . tizanidine (ZANAFLEX) 6 MG capsule    Sig: Take 1 capsule (6 mg total) by mouth 3 (three) times daily as needed for muscle spasms.    Dispense:  90 capsule    Refill:  1  . betamethasone acetate-betamethasone sodium phosphate (CELESTONE) injection 6 mg    Back exercise recommended Handout given. Perform BID  Follow up as needed.  Claretta Fraise, MD

## 2018-11-13 NOTE — Patient Instructions (Signed)

## 2019-01-11 DIAGNOSIS — M1711 Unilateral primary osteoarthritis, right knee: Secondary | ICD-10-CM | POA: Diagnosis not present

## 2019-01-11 DIAGNOSIS — M25561 Pain in right knee: Secondary | ICD-10-CM | POA: Diagnosis not present

## 2019-01-25 DIAGNOSIS — Z20822 Contact with and (suspected) exposure to covid-19: Secondary | ICD-10-CM | POA: Diagnosis not present

## 2019-02-16 DIAGNOSIS — Z125 Encounter for screening for malignant neoplasm of prostate: Secondary | ICD-10-CM | POA: Diagnosis not present

## 2019-02-16 DIAGNOSIS — I10 Essential (primary) hypertension: Secondary | ICD-10-CM | POA: Diagnosis not present

## 2019-02-16 DIAGNOSIS — M25561 Pain in right knee: Secondary | ICD-10-CM | POA: Diagnosis not present

## 2019-02-16 DIAGNOSIS — Z23 Encounter for immunization: Secondary | ICD-10-CM | POA: Diagnosis not present

## 2019-05-28 DIAGNOSIS — H40033 Anatomical narrow angle, bilateral: Secondary | ICD-10-CM | POA: Diagnosis not present

## 2019-05-28 DIAGNOSIS — H04123 Dry eye syndrome of bilateral lacrimal glands: Secondary | ICD-10-CM | POA: Diagnosis not present

## 2019-07-16 DIAGNOSIS — S43401D Unspecified sprain of right shoulder joint, subsequent encounter: Secondary | ICD-10-CM | POA: Diagnosis not present

## 2019-07-16 DIAGNOSIS — Z23 Encounter for immunization: Secondary | ICD-10-CM | POA: Diagnosis not present

## 2019-07-16 DIAGNOSIS — I10 Essential (primary) hypertension: Secondary | ICD-10-CM | POA: Diagnosis not present

## 2019-10-27 DIAGNOSIS — R42 Dizziness and giddiness: Secondary | ICD-10-CM | POA: Diagnosis not present

## 2019-10-27 DIAGNOSIS — H539 Unspecified visual disturbance: Secondary | ICD-10-CM | POA: Diagnosis not present

## 2019-10-27 DIAGNOSIS — M1990 Unspecified osteoarthritis, unspecified site: Secondary | ICD-10-CM | POA: Diagnosis not present

## 2019-10-27 DIAGNOSIS — I10 Essential (primary) hypertension: Secondary | ICD-10-CM | POA: Diagnosis not present

## 2019-10-27 DIAGNOSIS — R079 Chest pain, unspecified: Secondary | ICD-10-CM | POA: Diagnosis not present

## 2019-10-27 DIAGNOSIS — E860 Dehydration: Secondary | ICD-10-CM | POA: Diagnosis not present

## 2019-10-27 DIAGNOSIS — R6883 Chills (without fever): Secondary | ICD-10-CM | POA: Diagnosis not present

## 2019-10-27 DIAGNOSIS — I951 Orthostatic hypotension: Secondary | ICD-10-CM | POA: Diagnosis not present

## 2019-10-27 DIAGNOSIS — R350 Frequency of micturition: Secondary | ICD-10-CM | POA: Diagnosis not present

## 2019-11-01 DIAGNOSIS — R6883 Chills (without fever): Secondary | ICD-10-CM | POA: Diagnosis not present

## 2019-11-01 DIAGNOSIS — R0789 Other chest pain: Secondary | ICD-10-CM | POA: Diagnosis not present

## 2019-11-01 DIAGNOSIS — M1990 Unspecified osteoarthritis, unspecified site: Secondary | ICD-10-CM | POA: Diagnosis not present

## 2019-11-01 DIAGNOSIS — R42 Dizziness and giddiness: Secondary | ICD-10-CM | POA: Diagnosis not present

## 2019-11-01 DIAGNOSIS — I10 Essential (primary) hypertension: Secondary | ICD-10-CM | POA: Diagnosis not present

## 2019-11-02 DIAGNOSIS — R42 Dizziness and giddiness: Secondary | ICD-10-CM | POA: Diagnosis not present

## 2019-11-08 DIAGNOSIS — M79602 Pain in left arm: Secondary | ICD-10-CM | POA: Diagnosis not present

## 2019-11-08 DIAGNOSIS — R079 Chest pain, unspecified: Secondary | ICD-10-CM | POA: Diagnosis not present

## 2019-11-08 DIAGNOSIS — R002 Palpitations: Secondary | ICD-10-CM | POA: Diagnosis not present

## 2019-11-08 DIAGNOSIS — R0789 Other chest pain: Secondary | ICD-10-CM | POA: Diagnosis not present

## 2019-11-08 DIAGNOSIS — I10 Essential (primary) hypertension: Secondary | ICD-10-CM | POA: Diagnosis not present

## 2019-11-08 DIAGNOSIS — R000 Tachycardia, unspecified: Secondary | ICD-10-CM | POA: Diagnosis not present

## 2019-11-08 DIAGNOSIS — R42 Dizziness and giddiness: Secondary | ICD-10-CM | POA: Diagnosis not present

## 2019-11-08 DIAGNOSIS — Z87891 Personal history of nicotine dependence: Secondary | ICD-10-CM | POA: Diagnosis not present

## 2019-11-09 ENCOUNTER — Ambulatory Visit (INDEPENDENT_AMBULATORY_CARE_PROVIDER_SITE_OTHER): Payer: Federal, State, Local not specified - PPO | Admitting: Family Medicine

## 2019-11-09 ENCOUNTER — Encounter: Payer: Self-pay | Admitting: Family Medicine

## 2019-11-09 DIAGNOSIS — R0789 Other chest pain: Secondary | ICD-10-CM

## 2019-11-09 NOTE — Progress Notes (Signed)
Virtual Visit via telephone Note  I connected with Dina Rich on 11/09/19 at 1658 by telephone and verified that I am speaking with the correct person using two identifiers. Kennieth Plotts is currently located at [facility] and patient are currently with her during visit. The provider, Elige Radon Aydyn Testerman, MD is located in their office at time of visit.  Call ended at 1719  I discussed the limitations, risks, security and privacy concerns of performing an evaluation and management service by telephone and the availability of in person appointments. I also discussed with the patient that there may be a patient responsible charge related to this service. The patient expressed understanding and agreed to proceed.   History and Present Illness: Patient was in the ER at [facility] for chest pain yesterday.  He started having issues on 10/19 and developed lightheadedness and dizziness. He took his BP in afternoon after working outside.  140/78 and 116/77, 187/85, 152/85 that day.  He did not have chest pain that day.  2 weeks ago he had another episode and went to [facility] ED and had similar symptoms.  His BP was high that day and he was slightly dehydrated in ED.  He had another episode and they gave him clonidine and that helped with BP again. Now he is having this every day and it is happening when his heart rate and bp go up, even while sitting. 2 days ago he was awoken with similar symptoms. He then had chest pain and left shoulder pain. He did a treadmill stress test and it was normal. He takes losartan and clonidine and he takes clonidine only as needed.  He still has left shoulder pain.  He was sent home with robaxin and did not help.   His BP today was 152/85 and 164/79 and took one clonidine and later hs BP was 114/65.   1. Atypical chest pain     Outpatient Encounter Medications as of 11/09/2019  Medication Sig  . losartan (COZAAR) 50 MG tablet Take 50 mg by mouth daily.  . diclofenac (VOLTAREN) 75  MG EC tablet Take 1 tablet (75 mg total) by mouth 2 (two) times daily.  Marland Kitchen EPIPEN 2-PAK 0.3 MG/0.3ML SOAJ injection INJECT IM AS DIRECTED  . tizanidine (ZANAFLEX) 6 MG capsule Take 1 capsule (6 mg total) by mouth 3 (three) times daily as needed for muscle spasms.   Facility-Administered Encounter Medications as of 11/09/2019  Medication  . 0.9 %  sodium chloride infusion    Review of Systems  Constitutional: Positive for fatigue. Negative for chills and fever.  Respiratory: Negative for shortness of breath and wheezing.   Cardiovascular: Positive for chest pain and palpitations. Negative for leg swelling.  Musculoskeletal: Negative for back pain and gait problem.  Skin: Negative for rash.  All other systems reviewed and are negative.   Observations/Objective: Patient sounds comfortable and in no acute distress.   Assessment and Plan: Problem List Items Addressed This Visit    None    Visit Diagnoses    Atypical chest pain    -  Primary   Relevant Orders   Ambulatory referral to Cardiology      Patient has echo on Monday and wearing holter monitor now. Follow up plan: Return if symptoms worsen or fail to improve.     I discussed the assessment and treatment plan with the patient. The patient was provided an opportunity to ask questions and all were answered. The patient agreed with the plan and demonstrated  an understanding of the instructions.   The patient was advised to call back or seek an in-person evaluation if the symptoms worsen or if the condition fails to improve as anticipated.  The above assessment and management plan was discussed with the patient. The patient verbalized understanding of and has agreed to the management plan. Patient is aware to call the clinic if symptoms persist or worsen. Patient is aware when to return to the clinic for a follow-up visit. Patient educated on when it is appropriate to go to the emergency department.    I provided 21 minutes of  non-face-to-face time during this encounter.    Nils Pyle, MD

## 2019-11-12 DIAGNOSIS — I517 Cardiomegaly: Secondary | ICD-10-CM | POA: Diagnosis not present

## 2019-11-19 DIAGNOSIS — R61 Generalized hyperhidrosis: Secondary | ICD-10-CM | POA: Diagnosis not present

## 2019-11-19 DIAGNOSIS — R0789 Other chest pain: Secondary | ICD-10-CM | POA: Diagnosis not present

## 2019-11-19 DIAGNOSIS — Z23 Encounter for immunization: Secondary | ICD-10-CM | POA: Diagnosis not present

## 2019-11-23 DIAGNOSIS — I10 Essential (primary) hypertension: Secondary | ICD-10-CM | POA: Diagnosis not present

## 2019-11-23 DIAGNOSIS — R002 Palpitations: Secondary | ICD-10-CM | POA: Diagnosis not present

## 2019-11-23 DIAGNOSIS — R0789 Other chest pain: Secondary | ICD-10-CM | POA: Diagnosis not present

## 2019-12-04 DIAGNOSIS — I10 Essential (primary) hypertension: Secondary | ICD-10-CM | POA: Diagnosis not present

## 2019-12-04 DIAGNOSIS — R079 Chest pain, unspecified: Secondary | ICD-10-CM | POA: Diagnosis not present

## 2019-12-04 DIAGNOSIS — Z87891 Personal history of nicotine dependence: Secondary | ICD-10-CM | POA: Diagnosis not present

## 2019-12-04 DIAGNOSIS — Z79899 Other long term (current) drug therapy: Secondary | ICD-10-CM | POA: Diagnosis not present

## 2019-12-17 ENCOUNTER — Other Ambulatory Visit: Payer: Self-pay

## 2019-12-17 ENCOUNTER — Ambulatory Visit: Payer: Federal, State, Local not specified - PPO | Admitting: Family Medicine

## 2019-12-17 ENCOUNTER — Encounter: Payer: Self-pay | Admitting: Family Medicine

## 2019-12-17 VITALS — BP 130/73 | HR 95 | Ht 70.0 in | Wt 175.0 lb

## 2019-12-17 DIAGNOSIS — I10 Essential (primary) hypertension: Secondary | ICD-10-CM

## 2019-12-17 DIAGNOSIS — R0789 Other chest pain: Secondary | ICD-10-CM | POA: Diagnosis not present

## 2019-12-17 DIAGNOSIS — K219 Gastro-esophageal reflux disease without esophagitis: Secondary | ICD-10-CM

## 2019-12-17 MED ORDER — METHOCARBAMOL 750 MG PO TABS: 750.0000 mg | ORAL_TABLET | Freq: Three times a day (TID) | ORAL | 2 refills | Status: AC

## 2019-12-17 NOTE — Progress Notes (Signed)
BP 130/73    Pulse 95    Ht '5\' 10"'  (1.778 m)    Wt 175 lb (79.4 kg)    SpO2 94%    BMI 25.11 kg/m    Subjective:   Patient ID: Scott James, male    DOB: 08-Jan-1960, 59 y.o.   MRN: [id number]  HPI: Scott James is a 59 y.o. male presenting on 12/17/2019 for Medical Management of Chronic Issues (S/P echo)   HPI Chest pain/ED follow-up Patient was in the emergency department on 11/21 with chest pain on the left side of his chest.  We did a virtual visit here in our office.  His blood pressure was running up at the time and heart rate was up as well.  Patient had exercise stress treadmill that was good.  He also had a cardiac monitor that he wore for 11 days and they told was normal although we do not have a copy of that.  He did that through the [place] system.  He says he has been better for at least a couple weeks without any issues with chest pains.  Hypertension Patient is currently on losartan, and their blood pressure today is 130/73. Patient denies any lightheadedness or dizziness. Patient denies headaches, blurred vision, chest pains, shortness of breath, or weakness. Denies any side effects from medication and is content with current medication.   GERD Patient is currently on no medication currently has been doing well.  She denies any major symptoms or abdominal pain or belching or burping. She denies any blood in her stool or lightheadedness or dizziness.   Relevant past medical, surgical, family and social history reviewed and updated as indicated. Interim medical history since our last visit reviewed. Allergies and medications reviewed and updated.  Review of Systems  Constitutional: Negative for chills and fever.  HENT: Negative for ear pain and tinnitus.   Eyes: Negative for pain.  Respiratory: Negative for cough, chest tightness, shortness of breath and wheezing.   Cardiovascular: Negative for chest pain, palpitations and leg swelling.  Gastrointestinal: Negative for  abdominal pain, blood in stool, constipation and diarrhea.  Genitourinary: Negative for dysuria and hematuria.  Musculoskeletal: Negative for back pain and myalgias.  Skin: Negative for rash.  Neurological: Negative for dizziness, weakness and headaches.  Psychiatric/Behavioral: Negative for suicidal ideas.    Per HPI unless specifically indicated above   Allergies as of 12/17/2019      Reactions   Bee Venom    Severe swelling   Other       Medication List       Accurate as of December 17, 2019  1:40 PM. If you have any questions, ask your nurse or doctor.        diclofenac 75 MG EC tablet Commonly known as: VOLTAREN Take 1 tablet (75 mg total) by mouth 2 (two) times daily.   EpiPen 2-Pak 0.3 mg/0.3 mL Soaj injection Generic drug: EPINEPHrine INJECT IM AS DIRECTED   indomethacin 25 MG capsule Commonly known as: INDOCIN Take 25 mg by mouth in the morning, at noon, and at bedtime.   losartan 50 MG tablet Commonly known as: COZAAR Take 50 mg by mouth daily.   methocarbamol 750 MG tablet Commonly known as: ROBAXIN Take 750 mg by mouth 3 (three) times daily.   tizanidine 6 MG capsule Commonly known as: ZANAFLEX Take 1 capsule (6 mg total) by mouth 3 (three) times daily as needed for muscle spasms.        Objective:  BP 130/73    Pulse 95    Ht '5\' 10"'  (1.778 m)    Wt 175 lb (79.4 kg)    SpO2 94%    BMI 25.11 kg/m   Wt Readings from Last 3 Encounters:  12/17/19 175 lb (79.4 kg)  11/13/18 182 lb 6.4 oz (82.7 kg)  11/02/18 181 lb (82.1 kg)    Physical Exam Vitals and nursing note reviewed.  Constitutional:      General: He is not in acute distress.    Appearance: He is well-developed and well-nourished. He is not diaphoretic.  Eyes:     General: No scleral icterus.    Extraocular Movements: EOM normal.     Conjunctiva/sclera: Conjunctivae normal.  Neck:     Thyroid: No thyromegaly.  Cardiovascular:     Rate and Rhythm: Normal rate and regular rhythm.      Pulses: Intact distal pulses.     Heart sounds: Normal heart sounds. No murmur heard.   Pulmonary:     Effort: Pulmonary effort is normal. No respiratory distress.     Breath sounds: Normal breath sounds. No wheezing.  Musculoskeletal:        General: No edema. Normal range of motion.     Cervical back: Neck supple.  Lymphadenopathy:     Cervical: No cervical adenopathy.  Skin:    General: Skin is warm and dry.     Findings: No rash.  Neurological:     Mental Status: He is alert and oriented to person, place, and time.     Coordination: Coordination normal.  Psychiatric:        Mood and Affect: Mood and affect normal.        Behavior: Behavior normal.     Gave refill for Robaxin, seems like chest pain is mostly musculoskeletal versus nerve, he will follow-up as needed.  Assessment & Plan:   Problem List Items Addressed This Visit      Cardiovascular and Mediastinum   Hypertension - Primary   Relevant Orders   CMP14+EGFR     Digestive   GERD (gastroesophageal reflux disease)   Relevant Orders   CBC with Differential/Platelet    Other Visit Diagnoses    Atypical chest pain       Relevant Medications   methocarbamol (ROBAXIN) 750 MG tablet   Other Relevant Orders   Lipid panel      Need to monitor blood pressure and will see in the future. Follow up plan: Return in about 1 year (around 12/16/2020), or if symptoms worsen or fail to improve, for Well exam.  Counseling provided for all of the vaccine components No orders of the defined types were placed in this encounter.   Caryl Pina, MD [facility] 12/17/2019, 1:40 PM

## 2019-12-18 LAB — CMP14+EGFR
ALT: 29 IU/L (ref 0–44)
AST: 22 IU/L (ref 0–40)
Albumin/Globulin Ratio: 1.8 (ref 1.2–2.2)
Albumin: 4.2 g/dL (ref 3.8–4.9)
Alkaline Phosphatase: 94 IU/L (ref 44–121)
BUN/Creatinine Ratio: 18 (ref 10–24)
BUN: 24 mg/dL (ref 8–27)
Bilirubin Total: <0.2 mg/dL (ref 0.0–1.2)
CO2: 23 mmol/L (ref 20–29)
Calcium: 9.2 mg/dL (ref 8.6–10.2)
Chloride: 105 mmol/L (ref 96–106)
Creatinine, Ser: 1.32 mg/dL — ABNORMAL HIGH (ref 0.76–1.27)
GFR calc Af Amer: 67 mL/min/{1.73_m2} (ref >59)
GFR calc non Af Amer: 58 mL/min/{1.73_m2} — ABNORMAL LOW (ref >59)
Globulin, Total: 2.4 g/dL (ref 1.5–4.5)
Glucose: 73 mg/dL (ref 65–99)
Potassium: 4.8 mmol/L (ref 3.5–5.2)
Sodium: 139 mmol/L (ref 134–144)
Total Protein: 6.6 g/dL (ref 6.0–8.5)

## 2019-12-18 LAB — CBC WITH DIFFERENTIAL/PLATELET
Basophils Absolute: 0.1 10*3/uL (ref 0.0–0.2)
Basos: 1 %
EOS (ABSOLUTE): 0.4 10*3/uL (ref 0.0–0.4)
Eos: 3 %
Hematocrit: 44.4 % (ref 37.5–51.0)
Hemoglobin: 15.1 g/dL (ref 13.0–17.7)
Immature Grans (Abs): 0.1 10*3/uL (ref 0.0–0.1)
Immature Granulocytes: 1 %
Lymphocytes Absolute: 1.2 10*3/uL (ref 0.7–3.1)
Lymphs: 9 %
MCH: 32.3 pg (ref 26.6–33.0)
MCHC: 34 g/dL (ref 31.5–35.7)
MCV: 95 fL (ref 79–97)
Monocytes Absolute: 1.2 10*3/uL — ABNORMAL HIGH (ref 0.1–0.9)
Monocytes: 9 %
Neutrophils Absolute: 10.2 10*3/uL — ABNORMAL HIGH (ref 1.4–7.0)
Neutrophils: 77 %
Platelets: 273 10*3/uL (ref 150–450)
RBC: 4.68 x10E6/uL (ref 4.14–5.80)
RDW: 12.2 % (ref 11.6–15.4)
WBC: 13.2 10*3/uL — ABNORMAL HIGH (ref 3.4–10.8)

## 2019-12-18 LAB — LIPID PANEL
Chol/HDL Ratio: 4 ratio (ref 0.0–5.0)
Cholesterol, Total: 167 mg/dL (ref 100–199)
HDL: 42 mg/dL (ref >39)
LDL Chol Calc (NIH): 75 mg/dL (ref 0–99)
Triglycerides: 309 mg/dL — ABNORMAL HIGH (ref 0–149)
VLDL Cholesterol Cal: 50 mg/dL — ABNORMAL HIGH (ref 5–40)

## 2020-01-15 DIAGNOSIS — I10 Essential (primary) hypertension: Secondary | ICD-10-CM | POA: Diagnosis not present

## 2020-01-15 DIAGNOSIS — R079 Chest pain, unspecified: Secondary | ICD-10-CM | POA: Diagnosis not present

## 2020-04-03 DIAGNOSIS — Z86010 Personal history of colonic polyps: Secondary | ICD-10-CM | POA: Diagnosis not present

## 2020-05-17 DIAGNOSIS — S99912A Unspecified injury of left ankle, initial encounter: Secondary | ICD-10-CM | POA: Diagnosis not present

## 2020-05-17 DIAGNOSIS — S93402A Sprain of unspecified ligament of left ankle, initial encounter: Secondary | ICD-10-CM | POA: Diagnosis not present

## 2020-07-14 DIAGNOSIS — M25572 Pain in left ankle and joints of left foot: Secondary | ICD-10-CM | POA: Diagnosis not present

## 2020-07-14 DIAGNOSIS — M795 Residual foreign body in soft tissue: Secondary | ICD-10-CM | POA: Diagnosis not present

## 2020-07-14 DIAGNOSIS — M7732 Calcaneal spur, left foot: Secondary | ICD-10-CM | POA: Diagnosis not present

## 2020-07-14 DIAGNOSIS — R0989 Other specified symptoms and signs involving the circulatory and respiratory systems: Secondary | ICD-10-CM | POA: Diagnosis not present

## 2020-07-14 DIAGNOSIS — M25571 Pain in right ankle and joints of right foot: Secondary | ICD-10-CM | POA: Diagnosis not present

## 2020-07-14 DIAGNOSIS — M7731 Calcaneal spur, right foot: Secondary | ICD-10-CM | POA: Diagnosis not present

## 2020-07-14 DIAGNOSIS — M19071 Primary osteoarthritis, right ankle and foot: Secondary | ICD-10-CM | POA: Diagnosis not present

## 2020-07-28 DIAGNOSIS — J329 Chronic sinusitis, unspecified: Secondary | ICD-10-CM | POA: Diagnosis not present

## 2020-10-08 DIAGNOSIS — H40033 Anatomical narrow angle, bilateral: Secondary | ICD-10-CM | POA: Diagnosis not present

## 2020-10-08 DIAGNOSIS — H04123 Dry eye syndrome of bilateral lacrimal glands: Secondary | ICD-10-CM | POA: Diagnosis not present

## 2020-11-19 DIAGNOSIS — I10 Essential (primary) hypertension: Secondary | ICD-10-CM | POA: Diagnosis not present

## 2020-12-10 DIAGNOSIS — M25512 Pain in left shoulder: Secondary | ICD-10-CM | POA: Diagnosis not present

## 2020-12-17 ENCOUNTER — Encounter: Payer: Federal, State, Local not specified - PPO | Admitting: Family Medicine

## 2020-12-19 DIAGNOSIS — M7542 Impingement syndrome of left shoulder: Secondary | ICD-10-CM | POA: Diagnosis not present

## 2020-12-19 DIAGNOSIS — M19012 Primary osteoarthritis, left shoulder: Secondary | ICD-10-CM | POA: Diagnosis not present

## 2020-12-19 DIAGNOSIS — M7552 Bursitis of left shoulder: Secondary | ICD-10-CM | POA: Diagnosis not present

## 2020-12-19 DIAGNOSIS — M25512 Pain in left shoulder: Secondary | ICD-10-CM | POA: Diagnosis not present

## 2021-01-16 DIAGNOSIS — I10 Essential (primary) hypertension: Secondary | ICD-10-CM | POA: Diagnosis not present

## 2021-01-16 DIAGNOSIS — M7732 Calcaneal spur, left foot: Secondary | ICD-10-CM | POA: Diagnosis not present

## 2021-01-16 DIAGNOSIS — J309 Allergic rhinitis, unspecified: Secondary | ICD-10-CM | POA: Diagnosis not present

## 2021-01-16 DIAGNOSIS — M7502 Adhesive capsulitis of left shoulder: Secondary | ICD-10-CM | POA: Diagnosis not present

## 2021-01-21 DIAGNOSIS — M7542 Impingement syndrome of left shoulder: Secondary | ICD-10-CM | POA: Diagnosis not present

## 2021-01-21 DIAGNOSIS — M19012 Primary osteoarthritis, left shoulder: Secondary | ICD-10-CM | POA: Diagnosis not present

## 2021-01-21 DIAGNOSIS — M7552 Bursitis of left shoulder: Secondary | ICD-10-CM | POA: Diagnosis not present

## 2021-02-16 DIAGNOSIS — M25512 Pain in left shoulder: Secondary | ICD-10-CM | POA: Diagnosis not present

## 2021-03-16 DIAGNOSIS — M25512 Pain in left shoulder: Secondary | ICD-10-CM | POA: Diagnosis not present

## 2021-03-23 DIAGNOSIS — M7542 Impingement syndrome of left shoulder: Secondary | ICD-10-CM | POA: Diagnosis not present

## 2021-03-23 DIAGNOSIS — M19012 Primary osteoarthritis, left shoulder: Secondary | ICD-10-CM | POA: Diagnosis not present

## 2021-03-30 DIAGNOSIS — S80912A Unspecified superficial injury of left knee, initial encounter: Secondary | ICD-10-CM | POA: Diagnosis not present

## 2021-03-30 DIAGNOSIS — W1830XA Fall on same level, unspecified, initial encounter: Secondary | ICD-10-CM | POA: Diagnosis not present

## 2021-04-21 DIAGNOSIS — M25512 Pain in left shoulder: Secondary | ICD-10-CM | POA: Diagnosis not present

## 2021-05-18 DIAGNOSIS — I10 Essential (primary) hypertension: Secondary | ICD-10-CM | POA: Diagnosis not present

## 2021-05-22 DIAGNOSIS — M25512 Pain in left shoulder: Secondary | ICD-10-CM | POA: Diagnosis not present

## 2021-05-22 DIAGNOSIS — M25812 Other specified joint disorders, left shoulder: Secondary | ICD-10-CM | POA: Diagnosis not present

## 2021-05-22 DIAGNOSIS — M19012 Primary osteoarthritis, left shoulder: Secondary | ICD-10-CM | POA: Diagnosis not present

## 2021-05-22 DIAGNOSIS — M7552 Bursitis of left shoulder: Secondary | ICD-10-CM | POA: Diagnosis not present

## 2021-05-26 DIAGNOSIS — M25512 Pain in left shoulder: Secondary | ICD-10-CM | POA: Diagnosis not present

## 2021-09-07 IMAGING — DX DG KNEE 1-2V*R*
2 series · 2 of 2 positions shown · non-contrast
Comparison: No recent.

CLINICAL DATA: Knee pain.

EXAM:
RIGHT KNEE - 1-2 VIEW

[knee ap]
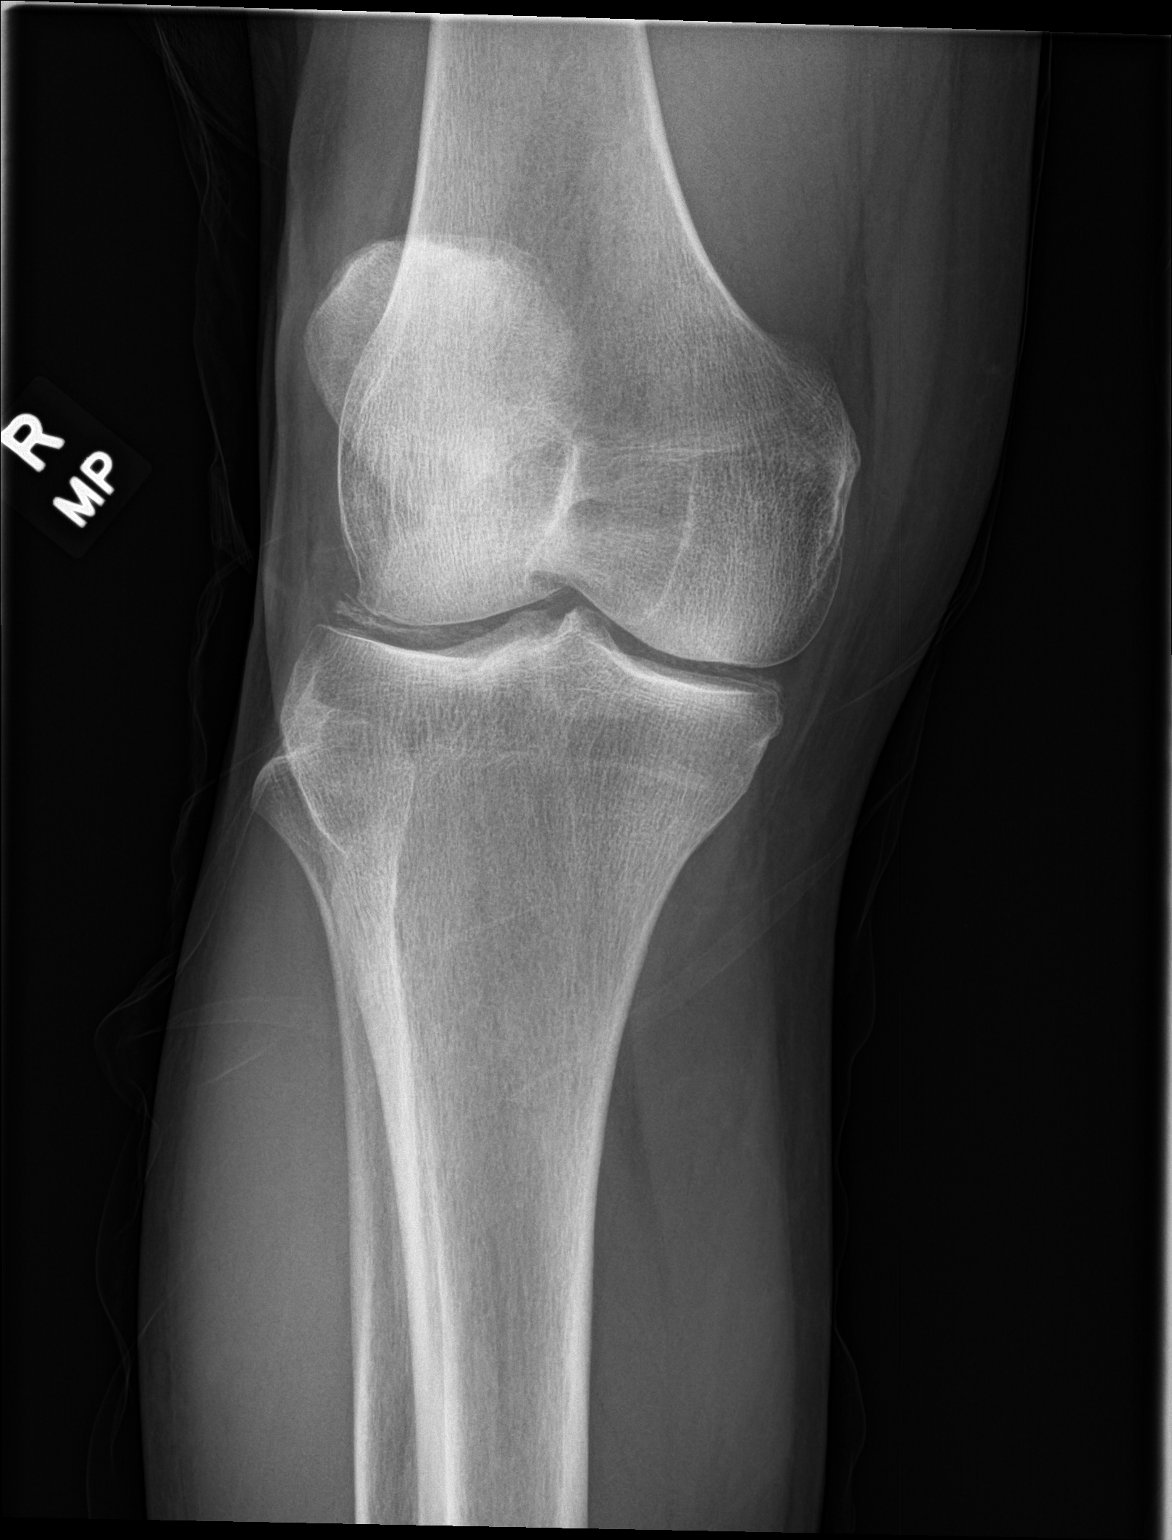

[knee lat]
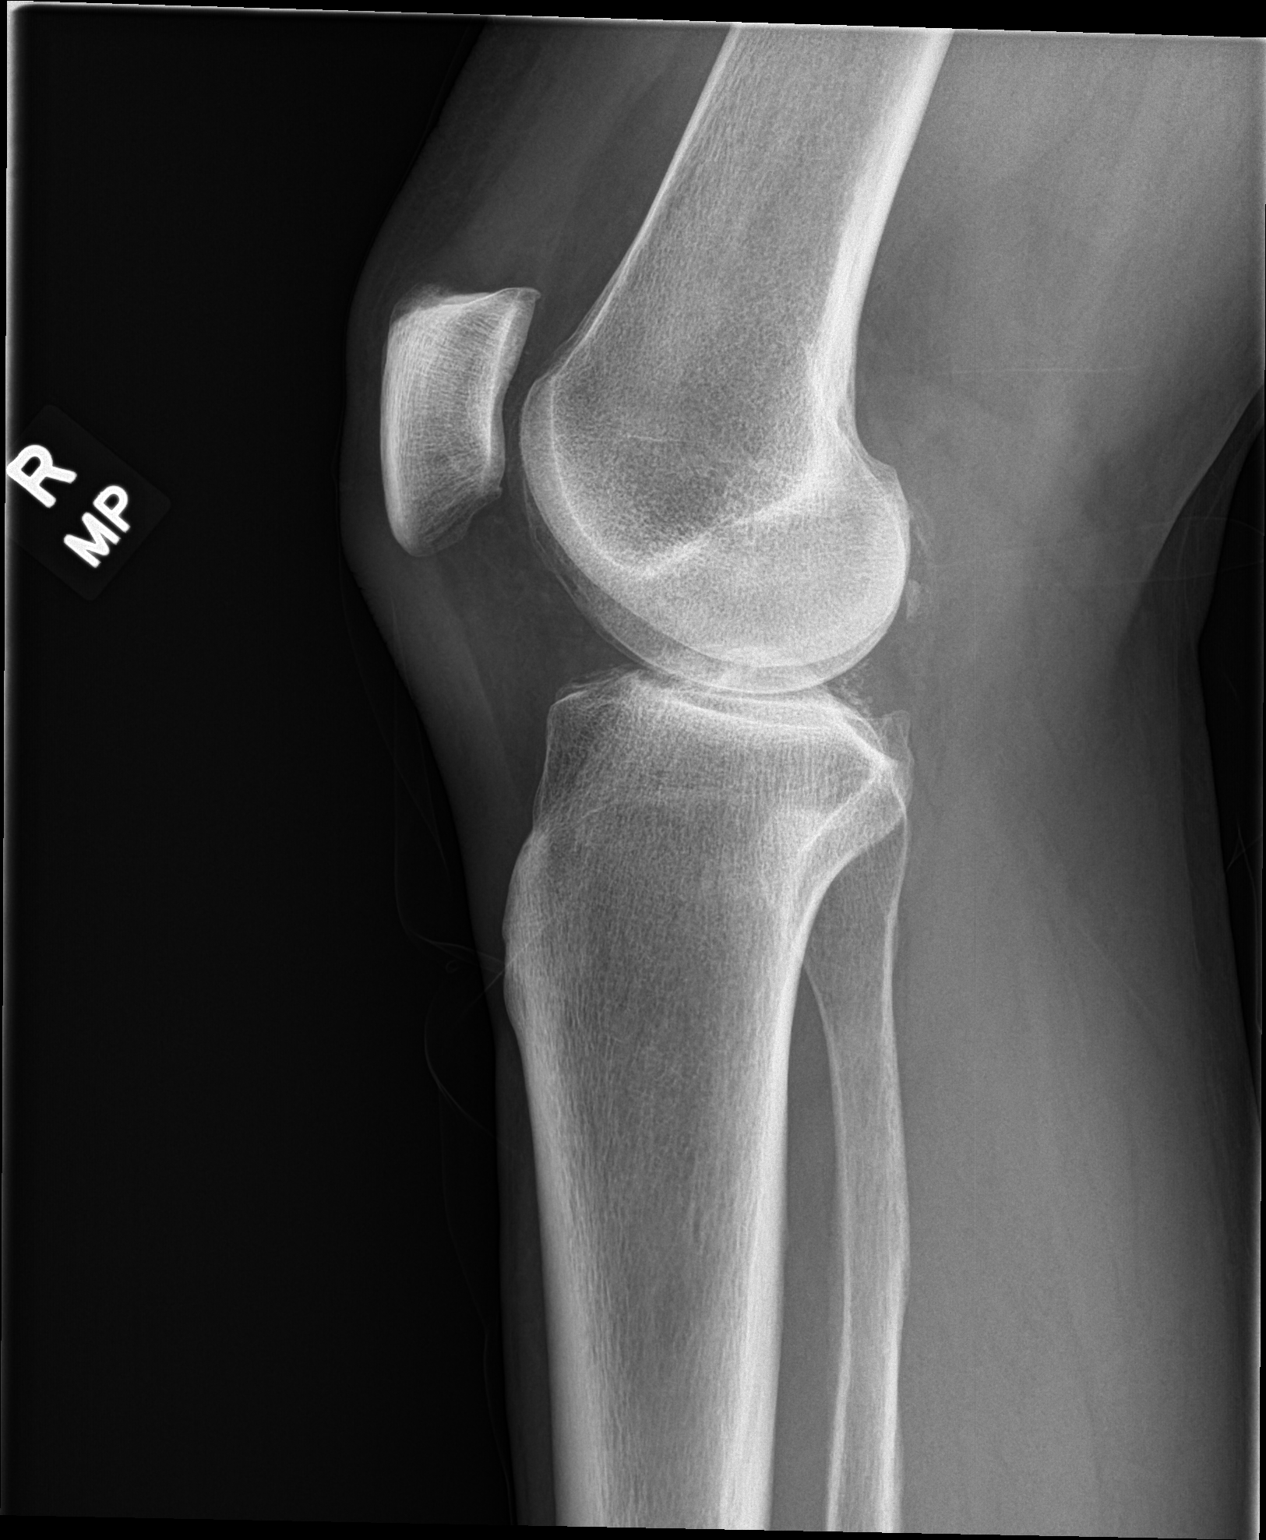

[2 of 2 positions shown; findings below may reference images not displayed]

FINDINGS: Mild Tricompartment degenerative change with chondrocalcinosis
noted. No acute bony abnormality. No evidence of fracture or
dislocation. No evidence of effusion.
IMPRESSION: Mild tricompartment degenerative change with chondrocalcinosis. No
acute bony abnormality.

## 2021-10-10 DIAGNOSIS — H40033 Anatomical narrow angle, bilateral: Secondary | ICD-10-CM | POA: Diagnosis not present

## 2021-10-10 DIAGNOSIS — H04123 Dry eye syndrome of bilateral lacrimal glands: Secondary | ICD-10-CM | POA: Diagnosis not present

## 2021-12-14 DIAGNOSIS — F419 Anxiety disorder, unspecified: Secondary | ICD-10-CM | POA: Diagnosis not present

## 2021-12-14 DIAGNOSIS — F4312 Post-traumatic stress disorder, chronic: Secondary | ICD-10-CM | POA: Diagnosis not present

## 2021-12-14 DIAGNOSIS — M25571 Pain in right ankle and joints of right foot: Secondary | ICD-10-CM | POA: Diagnosis not present

## 2021-12-14 DIAGNOSIS — X58XXXD Exposure to other specified factors, subsequent encounter: Secondary | ICD-10-CM | POA: Diagnosis not present

## 2021-12-14 DIAGNOSIS — S83200D Bucket-handle tear of unspecified meniscus, current injury, right knee, subsequent encounter: Secondary | ICD-10-CM | POA: Diagnosis not present

## 2021-12-30 ENCOUNTER — Ambulatory Visit: Payer: Federal, State, Local not specified - PPO | Admitting: Nurse Practitioner

## 2021-12-30 ENCOUNTER — Encounter: Payer: Self-pay | Admitting: Nurse Practitioner

## 2021-12-30 VITALS — BP 145/90 | HR 103 | Temp 99.5°F | Resp 20 | Ht 70.0 in | Wt 173.0 lb

## 2021-12-30 DIAGNOSIS — R0981 Nasal congestion: Secondary | ICD-10-CM

## 2021-12-30 DIAGNOSIS — J101 Influenza due to other identified influenza virus with other respiratory manifestations: Secondary | ICD-10-CM | POA: Diagnosis not present

## 2021-12-30 DIAGNOSIS — Z578 Occupational exposure to other risk factors: Secondary | ICD-10-CM | POA: Diagnosis not present

## 2021-12-30 LAB — VERITOR FLU A/B WAIVED
Influenza A: POSITIVE — AB
Influenza B: NEGATIVE

## 2021-12-30 LAB — RSV AG, IMMUNOCHR, WAIVED: RSV Ag, Immunochr, Waived: NEGATIVE

## 2021-12-30 MED ORDER — HYDROCODONE BIT-HOMATROP MBR 5-1.5 MG/5ML PO SOLN: 5.0000 mL | Freq: Four times a day (QID) | ORAL | 0 refills | Status: AC | PRN

## 2021-12-30 MED ORDER — OSELTAMIVIR PHOSPHATE 75 MG PO CAPS: 75.0000 mg | ORAL_CAPSULE | Freq: Two times a day (BID) | ORAL | 0 refills | Status: AC

## 2021-12-30 NOTE — Patient Instructions (Signed)

## 2021-12-30 NOTE — Progress Notes (Signed)
Subjective:    Patient ID: Scott James, male    DOB: 07/20/1960, 61 y.o.   MRN: [id number]  Chief Complaint: Cough   Cough This is a new problem. Episode onset: saturday. The problem has been waxing and waning. The problem occurs every few hours. The cough is Productive of sputum. Associated symptoms include ear congestion, a fever, myalgias, a sore throat and shortness of breath. Nothing aggravates the symptoms. He has tried OTC cough suppressant for the symptoms. The treatment provided mild relief.     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 [facility] called and said that victim tested positive for herpes and Hep c and patient needs to be tested.  Review of Systems  Constitutional:  Positive for fever.  HENT:  Positive for sore throat.   Respiratory:  Positive for cough and shortness of breath.   Musculoskeletal:  Positive for myalgias.       Objective:   Physical Exam Constitutional:      Appearance: Normal appearance.  HENT:     Right Ear: Tympanic membrane normal.     Left Ear: Tympanic membrane normal.     Nose: Congestion and rhinorrhea present.     Mouth/Throat:     Pharynx: Posterior oropharyngeal erythema present.  Cardiovascular:     Rate and Rhythm: Normal rate and regular rhythm.     Heart sounds: Normal heart sounds.  Pulmonary:     Effort: Pulmonary effort is normal.     Breath sounds: Normal breath sounds.  Skin:    General: Skin is warm.  Neurological:     General: No focal deficit present.     Mental Status: He is alert and oriented to person, place, and time.  Psychiatric:        Mood and Affect: Mood normal.        Behavior: Behavior normal.    BP (!) 145/90   Pulse (!) 103   Temp 99.5 F (37.5 C) (Temporal)   Resp 20   Ht 5\' 10"  (1.778 m)   Wt 173 lb (78.5 kg)   SpO2 96%   BMI 24.82 kg/m   Influenza A positive       Assessment & Plan:   Scott James in today with  chief complaint of Cough and Nasal Congestion   1. Nasal congestion - Veritor Flu A/B Waived - RSV Ag, Immunochr, Waived - Novel Coronavirus, NAA ([facility])  2. Influenza A 1. Take meds as prescribed 2. Use a cool mist humidifier especially during the winter months and when heat has been humid. 3. Use saline nose sprays frequently 4. Saline irrigations of the nose can be very helpful if done frequently.  * 4X daily for 1 week*  * Use of a nettie pot can be helpful with this. Follow directions with this* 5. Drink plenty of fluids 6. Keep thermostat turn down low 7.For any cough or congestion- hycodan 8. For fever or aces or pains- take tylenol or ibuprofen appropriate for age and weight.  * for fevers greater than 101 orally you may alternate ibuprofen and tylenol every  3 hours.    - oseltamivir (TAMIFLU) 75 MG capsule; Take 1 capsule (75 mg total) by mouth 2 (two) times daily.  Dispense: 10 capsule; Refill: 0 - HYDROcodone bit-homatropine (HYCODAN) 5-1.5 MG/5ML syrup; Take 5 mLs by mouth every 6 (six) hours as needed for cough.  Dispense: 120 mL; Refill: 0  3. 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 The patient verbalized understanding of and has agreed to the management plan. Patient is aware to call the clinic if symptoms persist or worsen. Patient is aware when to return to the clinic for a follow-up visit. Patient educated on when it is appropriate to go to the emergency department.   Mary-Margaret Daphine Deutscher, FNP

## 2021-12-31 LAB — HEPATITIS C ANTIBODY: Hep C Virus Ab: NONREACTIVE

## 2021-12-31 LAB — VARICELLA ZOSTER ANTIBODY, IGG: Varicella zoster IgG: 3802 index (ref >165)

## 2022-01-01 LAB — NOVEL CORONAVIRUS, NAA: SARS-CoV-2, NAA: NOT DETECTED

## 2022-01-21 DIAGNOSIS — M25562 Pain in left knee: Secondary | ICD-10-CM | POA: Diagnosis not present

## 2022-01-21 DIAGNOSIS — M25561 Pain in right knee: Secondary | ICD-10-CM | POA: Diagnosis not present

## 2022-02-10 DIAGNOSIS — F419 Anxiety disorder, unspecified: Secondary | ICD-10-CM | POA: Diagnosis not present

## 2022-02-15 DIAGNOSIS — K5900 Constipation, unspecified: Secondary | ICD-10-CM | POA: Diagnosis not present

## 2022-02-15 DIAGNOSIS — M797 Fibromyalgia: Secondary | ICD-10-CM | POA: Diagnosis not present

## 2022-02-15 DIAGNOSIS — R162 Hepatomegaly with splenomegaly, not elsewhere classified: Secondary | ICD-10-CM | POA: Diagnosis not present

## 2022-02-15 DIAGNOSIS — C20 Malignant neoplasm of rectum: Secondary | ICD-10-CM | POA: Diagnosis not present

## 2022-02-15 DIAGNOSIS — K766 Portal hypertension: Secondary | ICD-10-CM | POA: Diagnosis not present

## 2022-02-15 DIAGNOSIS — K769 Liver disease, unspecified: Secondary | ICD-10-CM | POA: Diagnosis not present

## 2022-02-15 DIAGNOSIS — K629 Disease of anus and rectum, unspecified: Secondary | ICD-10-CM | POA: Diagnosis not present

## 2022-02-15 DIAGNOSIS — R64 Cachexia: Secondary | ICD-10-CM | POA: Diagnosis not present

## 2022-02-15 DIAGNOSIS — Z800 Family history of malignant neoplasm of digestive organs: Secondary | ICD-10-CM | POA: Diagnosis not present

## 2022-02-15 DIAGNOSIS — Q612 Polycystic kidney, adult type: Secondary | ICD-10-CM | POA: Diagnosis not present

## 2022-02-15 DIAGNOSIS — K921 Melena: Secondary | ICD-10-CM | POA: Diagnosis not present

## 2022-02-15 DIAGNOSIS — Q446 Cystic disease of liver: Secondary | ICD-10-CM | POA: Diagnosis not present

## 2022-02-15 DIAGNOSIS — M6250 Muscle wasting and atrophy, not elsewhere classified, unspecified site: Secondary | ICD-10-CM | POA: Diagnosis not present

## 2022-02-15 DIAGNOSIS — F17210 Nicotine dependence, cigarettes, uncomplicated: Secondary | ICD-10-CM | POA: Diagnosis not present

## 2022-02-15 DIAGNOSIS — D649 Anemia, unspecified: Secondary | ICD-10-CM | POA: Diagnosis not present

## 2022-02-16 DIAGNOSIS — M25561 Pain in right knee: Secondary | ICD-10-CM | POA: Diagnosis not present

## 2022-02-16 DIAGNOSIS — M23232 Derangement of other medial meniscus due to old tear or injury, left knee: Secondary | ICD-10-CM | POA: Diagnosis not present

## 2022-02-16 DIAGNOSIS — M23231 Derangement of other medial meniscus due to old tear or injury, right knee: Secondary | ICD-10-CM | POA: Diagnosis not present

## 2022-02-16 DIAGNOSIS — M25562 Pain in left knee: Secondary | ICD-10-CM | POA: Diagnosis not present

## 2022-04-23 ENCOUNTER — Encounter: Payer: Self-pay | Admitting: Gastroenterology

## 2022-06-24 DIAGNOSIS — I10 Essential (primary) hypertension: Secondary | ICD-10-CM | POA: Diagnosis not present

## 2022-08-02 DIAGNOSIS — H903 Sensorineural hearing loss, bilateral: Secondary | ICD-10-CM | POA: Diagnosis not present

## 2022-08-02 DIAGNOSIS — H9313 Tinnitus, bilateral: Secondary | ICD-10-CM | POA: Diagnosis not present

## 2022-08-25 DIAGNOSIS — F419 Anxiety disorder, unspecified: Secondary | ICD-10-CM | POA: Diagnosis not present

## 2022-09-01 DIAGNOSIS — F419 Anxiety disorder, unspecified: Secondary | ICD-10-CM | POA: Diagnosis not present

## 2022-10-08 DIAGNOSIS — H40033 Anatomical narrow angle, bilateral: Secondary | ICD-10-CM | POA: Diagnosis not present

## 2022-10-08 DIAGNOSIS — H04123 Dry eye syndrome of bilateral lacrimal glands: Secondary | ICD-10-CM | POA: Diagnosis not present

## 2023-01-18 DIAGNOSIS — S99821A Other specified injuries of right foot, initial encounter: Secondary | ICD-10-CM | POA: Diagnosis not present

## 2023-03-18 ENCOUNTER — Encounter: Payer: Self-pay | Admitting: Gastroenterology

## 2023-04-21 ENCOUNTER — Ambulatory Visit (AMBULATORY_SURGERY_CENTER): Payer: Self-pay

## 2023-04-21 VITALS — Ht 70.0 in | Wt 175.0 lb

## 2023-04-21 DIAGNOSIS — Z1211 Encounter for screening for malignant neoplasm of colon: Secondary | ICD-10-CM

## 2023-04-21 DIAGNOSIS — Z860100 Personal history of colon polyps, unspecified: Secondary | ICD-10-CM

## 2023-04-21 NOTE — Progress Notes (Signed)
 No egg or soy allergy known to patient  No issues known to pt with past sedation with any surgeries or procedures Patient denies ever being told they had issues or difficulty with intubation  No FH of Malignant Hyperthermia Pt is not on diet pills Pt is not on  home 02  Pt is not on blood thinners  Pt denies issues with constipation  No A fib or A flutter Have any cardiac testing pending--no  LOA: independent  Prep: Miralax   Patient's chart reviewed by Rogena Class CNRA prior to previsit and patient appropriate for the [facility].  Previsit completed and red dot placed by patient's name on their procedure day (on provider's schedule).     PV completed with patient. Prep instructions sent via mychart and home address.

## 2023-05-12 ENCOUNTER — Encounter: Payer: Self-pay | Admitting: Gastroenterology

## 2023-05-12 ENCOUNTER — Ambulatory Visit: Admitting: Gastroenterology

## 2023-05-12 VITALS — BP 126/75 | HR 75 | Temp 98.4°F | Resp 12 | Ht 70.0 in | Wt 180.0 lb

## 2023-05-12 DIAGNOSIS — Z860101 Personal history of adenomatous and serrated colon polyps: Secondary | ICD-10-CM

## 2023-05-12 DIAGNOSIS — Z1211 Encounter for screening for malignant neoplasm of colon: Secondary | ICD-10-CM | POA: Diagnosis present

## 2023-05-12 DIAGNOSIS — K5730 Diverticulosis of large intestine without perforation or abscess without bleeding: Secondary | ICD-10-CM | POA: Diagnosis not present

## 2023-05-12 DIAGNOSIS — K648 Other hemorrhoids: Secondary | ICD-10-CM

## 2023-05-12 DIAGNOSIS — Z860100 Personal history of colon polyps, unspecified: Secondary | ICD-10-CM

## 2023-05-12 MED ORDER — SODIUM CHLORIDE 0.9 % IV SOLN: 500.0000 mL | INTRAVENOUS | Status: DC

## 2023-05-12 NOTE — Progress Notes (Signed)
 Sedate, gd SR, tolerated procedure well, VSS, report to RN

## 2023-05-12 NOTE — Progress Notes (Signed)
 Pt's states no medical or surgical changes since previsit or office visit.

## 2023-05-12 NOTE — Progress Notes (Signed)
 [facility] History and Physical   Primary Care Physician:  Dettinger, Lucio Sabin, MD   Reason for Procedure:   History of colon polyps  Plan:    colonoscopy     HPI: Scott James is a 63 y.o. male  here for colonoscopy surveillance - history of colon polyps, advanced TVA in the past removed, last exam 2019 - normal   . Patient denies any bowel symptoms at this time. No family history of colon cancer known. Otherwise feels well without any cardiopulmonary symptoms.   I have discussed risks / benefits of anesthesia and endoscopic procedure with [name] and they wish to proceed with the exams as outlined today.    Past Medical History:  Diagnosis Date   Allergy    seasonal   Arthritis    rt ankle   Bulging disc    L4-5/ no surgery   GERD (gastroesophageal reflux disease)    Hypertension    on meds twice in the past    Past Surgical History:  Procedure Laterality Date   ANKLE FRACTURE SURGERY Right 1988   shatterred talleous    CHOLECYSTECTOMY     3/32015   HAND SURGERY     left hand   JOINT REPLACEMENT     rt knee arthroscopy   KNEE ARTHROSCOPY Left 2018   torn cartilage   SHOULDER SURGERY Right    tonsillectomy      Prior to Admission medications   Medication Sig Start Date End Date Taking? Authorizing Provider  losartan (COZAAR) 50 MG tablet Take 50 mg by mouth daily.   Yes [provider]  nabumetone (RELAFEN) 500 MG tablet Take 500 mg by mouth 2 (two) times daily.   Yes [provider]  diclofenac  (VOLTAREN ) 75 MG EC tablet Take 1 tablet (75 mg total) by mouth 2 (two) times daily. Patient not taking: Reported on 12/30/2021 05/31/17   Roise Cleaver, MD  DULoxetine (CYMBALTA) 30 MG capsule Take 30 mg by mouth daily. Patient not taking: Reported on 05/12/2023    [provider]  EPIPEN  2-PAK 0.3 MG/0.3ML SOAJ injection INJECT IM AS DIRECTED    Joline Ned, MD  HYDROcodone  bit-homatropine (HYCODAN) 5-1.5 MG/5ML  syrup Take 5 mLs by mouth every 6 (six) hours as needed for cough. Patient not taking: Reported on 05/12/2023 12/30/21   Delfina Feller, FNP  indomethacin (INDOCIN) 25 MG capsule Take 25 mg by mouth in the morning, at noon, and at bedtime. Patient not taking: Reported on 12/30/2021    [provider]  methocarbamol  (ROBAXIN ) 750 MG tablet Take 1 tablet (750 mg total) by mouth 3 (three) times daily. Patient not taking: Reported on 12/30/2021 12/17/19   Dettinger, Lucio Sabin, MD  Multiple Vitamin (MULTI-VITAMIN) tablet Take 1 tablet by mouth daily. Patient not taking: Reported on 05/12/2023 11/08/19   [provider]  oseltamivir  (TAMIFLU ) 75 MG capsule Take 1 capsule (75 mg total) by mouth 2 (two) times daily. Patient not taking: Reported on 04/21/2023 12/30/21   Delfina Feller, FNP  tizanidine  (ZANAFLEX ) 6 MG capsule Take 1 capsule (6 mg total) by mouth 3 (three) times daily as needed for muscle spasms. Patient not taking: Reported on 04/21/2023 11/13/18   Roise Cleaver, MD    Current Outpatient Medications  Medication Sig Dispense Refill   losartan (COZAAR) 50 MG tablet Take 50 mg by mouth daily.     nabumetone (RELAFEN) 500 MG tablet Take 500 mg by mouth 2 (two) times daily.  diclofenac  (VOLTAREN ) 75 MG EC tablet Take 1 tablet (75 mg total) by mouth 2 (two) times daily. (Patient not taking: Reported on 12/30/2021) 60 tablet 0   DULoxetine (CYMBALTA) 30 MG capsule Take 30 mg by mouth daily. (Patient not taking: Reported on 05/12/2023)     EPIPEN  2-PAK 0.3 MG/0.3ML SOAJ injection INJECT IM AS DIRECTED 2 Device 0   HYDROcodone  bit-homatropine (HYCODAN) 5-1.5 MG/5ML syrup Take 5 mLs by mouth every 6 (six) hours as needed for cough. (Patient not taking: Reported on 05/12/2023) 120 mL 0   indomethacin (INDOCIN) 25 MG capsule Take 25 mg by mouth in the morning, at noon, and at bedtime. (Patient not taking: Reported on 12/30/2021)     methocarbamol  (ROBAXIN ) 750 MG tablet Take  1 tablet (750 mg total) by mouth 3 (three) times daily. (Patient not taking: Reported on 12/30/2021) 60 tablet 2   Multiple Vitamin (MULTI-VITAMIN) tablet Take 1 tablet by mouth daily. (Patient not taking: Reported on 05/12/2023)     oseltamivir  (TAMIFLU ) 75 MG capsule Take 1 capsule (75 mg total) by mouth 2 (two) times daily. (Patient not taking: Reported on 04/21/2023) 10 capsule 0   tizanidine  (ZANAFLEX ) 6 MG capsule Take 1 capsule (6 mg total) by mouth 3 (three) times daily as needed for muscle spasms. (Patient not taking: Reported on 04/21/2023) 90 capsule 1   Current Facility-Administered Medications  Medication Dose Route Frequency Provider Last Rate Last Admin   0.9 %  sodium chloride  infusion  500 mL Intravenous Once Indigo Barbian, Lendon Queen, MD       0.9 %  sodium chloride  infusion  500 mL Intravenous Continuous Keyerra Lamere, Lendon Queen, MD        Allergies as of 05/12/2023 - Review Complete 05/12/2023  Allergen Reaction Noted   Bee venom  12/22/2015   Pollen extract Other (See Comments) 05/12/2023    Family History  Problem Relation Age of Onset   Hypertension Mother    Non-Hodgkin's lymphoma Father    Prostate cancer Brother    Colon cancer Neg Hx    Rectal cancer Neg Hx    Stomach cancer Neg Hx     Social History   Socioeconomic History   Marital status: Married    Spouse name: Not on file   Number of children: Not on file   Years of education: Not on file   Highest education level: Not on file  Occupational History   Not on file  Tobacco Use   Smoking status: Former    Current packs/day: 0.00    Types: Cigarettes    Quit date: 02/07/2007    Years since quitting: 16.2   Smokeless tobacco: Never  Substance and Sexual Activity   Alcohol use: Yes    Comment: rarely   Drug use: No   Sexual activity: Not on file  Other Topics Concern   Not on file  Social History Narrative   Not on file   Social Drivers of Health   Financial Resource Strain: Not on file  Food  Insecurity: Not on file  Transportation Needs: Not on file  Physical Activity: Not on file  Stress: Not on file  Social Connections: Not on file  Intimate Partner Violence: Not on file    Review of Systems: All other review of systems negative except as mentioned in the HPI.  Physical Exam: Vital signs BP (!) 152/93   Pulse 81   Temp 98.4 F (36.9 C)   Ht 5\' 10"  (1.778 m)   Wt  180 lb (81.6 kg)   SpO2 96%   BMI 25.83 kg/m   General:   Alert,  Well-developed, pleasant and cooperative in NAD Lungs:  Clear throughout to auscultation.   Heart:  Regular rate and rhythm Abdomen:  Soft, nontender and nondistended.   Neuro/Psych:  Alert and cooperative. Normal mood and affect. A and O x 3  Christi Coward, MD [facility]

## 2023-05-12 NOTE — Patient Instructions (Addendum)
 Resume previous diet.                           - Continue present medications.                           - No polyps since 2008 at which time the patient                            had an advanced TVA. Given history of advanced                            polyp remotely repeat colonoscopy in 5 years for                            surveillance.  Handout on diverticulosis given.      YOU HAD AN ENDOSCOPIC PROCEDURE TODAY AT THE [facility]:   Refer to the procedure report that was given to you for any specific questions about what was found during the examination.  If the procedure report does not answer your questions, please call your gastroenterologist to clarify.  If you requested that your care partner not be given the details of your procedure findings, then the procedure report has been included in a sealed envelope for you to review at your convenience later.  YOU SHOULD EXPECT: Some feelings of bloating in the abdomen. Passage of more gas than usual.  Walking can help get rid of the air that was put into your GI tract during the procedure and reduce the bloating. If you had a lower endoscopy (such as a colonoscopy or flexible sigmoidoscopy) you may notice spotting of blood in your stool or on the toilet paper. If you underwent a bowel prep for your procedure, you may not have a normal bowel movement for a few days.  Please Note:  You might notice some irritation and congestion in your nose or some drainage.  This is from the oxygen used during your procedure.  There is no need for concern and it should clear up in a day or so.  SYMPTOMS TO REPORT IMMEDIATELY:  Following lower endoscopy (colonoscopy or flexible sigmoidoscopy):  Excessive amounts of blood in the stool  Significant tenderness or worsening of abdominal pains  Swelling of the abdomen that is new, acute  Fever of 100F or higher   For urgent or emergent issues, a gastroenterologist can be reached at any hour  by calling [phone]. Do not use MyChart messaging for urgent concerns.    DIET:  We do recommend a small meal at first, but then you may proceed to your regular diet.  Drink plenty of fluids but you should avoid alcoholic beverages for 24 hours.  ACTIVITY:  You should plan to take it easy for the rest of today and you should NOT DRIVE or use heavy machinery until tomorrow (because of the sedation medicines used during the test).    FOLLOW UP: Our staff will call the number listed on your records the next business day following your procedure.  We will call around 7:15- 8:00 am to check on you and address any questions or concerns that you may have regarding the information given to you following your procedure. If we do not reach you, we will leave  a message.     If any biopsies were taken you will be contacted by phone or by letter within the next 1-3 weeks.  Please call us  at [phone] if you have not heard about the biopsies in 3 weeks.    SIGNATURES/CONFIDENTIALITY: You and/or your care partner have signed paperwork which will be entered into your electronic medical record.  These signatures attest to the fact that that the information above on your After Visit Summary has been reviewed and is understood.  Full responsibility of the confidentiality of this discharge information lies with you and/or your care-partner.

## 2023-05-12 NOTE — Op Note (Signed)
  [facility] Patient Name: Scott James Procedure Date: 05/12/2023 9:49 AM MRN: [id number] Endoscopist: Landon Pinion P. General Kenner , MD, [id number] Age: 63 Referring MD:  Date of Birth: [birth year] Gender: Male Account #: [id number] Procedure:                Colonoscopy Indications:              High risk colon cancer surveillance: Personal                            history of colonic polyps - advanced TVA removed                            2008, last exam 2019 negative. Medicines:                Monitored Anesthesia Care Procedure:                Pre-Anesthesia Assessment:                           - Prior to the procedure, a History and Physical                            was performed, and patient medications and                            allergies were reviewed. The patient's tolerance of                            previous anesthesia was also reviewed. The risks                            and benefits of the procedure and the sedation                            options and risks were discussed with the patient.                            All questions were answered, and informed consent                            was obtained. Prior Anticoagulants: The patient has                            taken no anticoagulant or antiplatelet agents. ASA                            Grade Assessment: II - A patient with mild systemic                            disease. After reviewing the risks and benefits,                            the patient was deemed in satisfactory condition to  undergo the procedure.                           After obtaining informed consent, the colonoscope                            was passed under direct vision. Throughout the                            procedure, the patient's blood pressure, pulse, and                            oxygen saturations were monitored continuously. The                            CF [id number] #[id number] was  introduced through the anus                            and advanced to the the cecum, identified by                            appendiceal orifice and ileocecal valve. The                            colonoscopy was performed without difficulty. The                            patient tolerated the procedure well. The quality                            of the bowel preparation was good. The ileocecal                            valve, appendiceal orifice, and rectum were                            photographed. Scope In: 9:51:53 AM Scope Out: 10:08:46 AM Scope Withdrawal Time: 0 hours 12 minutes 39 seconds  Total Procedure Duration: 0 hours 16 minutes 53 seconds  Findings:                 The perianal and digital rectal examinations were                            normal.                           A few small-mouthed diverticula were found in the                            sigmoid colon.                           Internal hemorrhoids were found during  retroflexion. The hemorrhoids were small.                           The exam was otherwise without abnormality. Complications:            No immediate complications. Estimated blood loss:                            None. Estimated Blood Loss:     Estimated blood loss: none. Impression:               - Diverticulosis in the sigmoid colon.                           - Internal hemorrhoids.                           - The examination was otherwise normal.                           - No polyps. Recommendation:           - Patient has a contact number available for                            emergencies. The signs and symptoms of potential                            delayed complications were discussed with the                            patient. Return to normal activities tomorrow.                            Written discharge instructions were provided to the                            patient.                            - Resume previous diet.                           - Continue present medications.                           - No polyps since 2008 at which time the patient                            had an advanced TVA. Given history of advanced                            polyp remotely repeat colonoscopy in 5 years for                            surveillance. Landon Pinion P. Kennet Mccort, MD 05/12/2023 10:19:15 AM This report has been signed electronically.

## 2023-05-13 ENCOUNTER — Telehealth: Payer: Self-pay

## 2023-05-13 NOTE — Telephone Encounter (Signed)
 Attempted to reach patient for post-procedure f/u call. No answer. Left message for him to please not hesitate to call if he has any questions/concerns regarding his care.

## 2023-05-19 DIAGNOSIS — R519 Headache, unspecified: Secondary | ICD-10-CM | POA: Diagnosis not present

## 2023-05-19 DIAGNOSIS — I10 Essential (primary) hypertension: Secondary | ICD-10-CM | POA: Diagnosis not present

## 2023-05-19 DIAGNOSIS — F4311 Post-traumatic stress disorder, acute: Secondary | ICD-10-CM | POA: Diagnosis not present

## 2023-05-19 DIAGNOSIS — K5730 Diverticulosis of large intestine without perforation or abscess without bleeding: Secondary | ICD-10-CM | POA: Diagnosis not present

## 2023-10-13 DIAGNOSIS — H40033 Anatomical narrow angle, bilateral: Secondary | ICD-10-CM | POA: Diagnosis not present

## 2023-10-13 DIAGNOSIS — H04123 Dry eye syndrome of bilateral lacrimal glands: Secondary | ICD-10-CM | POA: Diagnosis not present

## 2023-11-09 ENCOUNTER — Ambulatory Visit: Payer: Self-pay | Admitting: Family Medicine

## 2023-11-09 ENCOUNTER — Ambulatory Visit: Payer: Self-pay

## 2023-11-09 ENCOUNTER — Encounter: Payer: Self-pay | Admitting: Family Medicine

## 2023-11-09 ENCOUNTER — Ambulatory Visit: Admitting: Family Medicine

## 2023-11-09 VITALS — BP 139/82 | HR 82 | Temp 97.8°F | Wt 194.0 lb

## 2023-11-09 DIAGNOSIS — J029 Acute pharyngitis, unspecified: Secondary | ICD-10-CM

## 2023-11-09 LAB — VERITOR SARS-COV-2 AND FLU A+B
BD Veritor SARS-CoV-2 Ag: NEGATIVE
Influenza A: NEGATIVE
Influenza B: NEGATIVE

## 2023-11-09 MED ORDER — PREDNISONE 10 MG PO TABS: ORAL_TABLET | ORAL | 0 refills | Status: AC

## 2023-11-09 MED ORDER — PSEUDOEPHEDRINE-GUAIFENESIN ER 120-1200 MG PO TB12: 1.0000 | ORAL_TABLET | Freq: Two times a day (BID) | ORAL | 99 refills | Status: AC

## 2023-11-09 MED ORDER — AMOXICILLIN-POT CLAVULANATE 875-125 MG PO TABS: 1.0000 | ORAL_TABLET | Freq: Two times a day (BID) | ORAL | 0 refills | Status: AC

## 2023-11-09 NOTE — Progress Notes (Signed)
 Subjective:  Patient ID: Scott James, male    DOB: 1960-10-21  Age: 63 y.o. MRN: [id number]  CC: sick (Started on Monday with sore throat and sinus issues.  Chills bit not sure if fever. )   HPI  Discussed the use of AI scribe software for clinical note transcription with the patient, who gave verbal consent to proceed.  History of Present Illness Scott James is a 63 year old male with a history of sinus infections who presents with sore throat and cough.  He developed a sore throat two days ago after returning from [place] , where he had been since last Friday. The sore throat was soon followed by a headache and sinus pressure, which began on Monday night. The sinus pressure is located in the front of his head, around his eyes and cheeks.  He has a history of sinus infections and has experienced similar symptoms in the past. He reports chills but has not measured his temperature. He has been using approximately two boxes of tissues due to a runny nose, which he describes as running like water and clear. No earaches are present.  He mentions a mild cough and attributes his sore throat to drainage. He also experiences a burning sensation when sneezing.  He recently traveled to [place]  for a funeral.          11/09/2023   12:01 PM 12/17/2019    1:33 PM 11/13/2018    4:11 PM  Depression screen PHQ 2/9  Decreased Interest 1 0 0  Down, Depressed, Hopeless 1 0 0  PHQ - 2 Score 2 0 0  Altered sleeping 3    Tired, decreased energy 2    Change in appetite 1    Feeling bad or failure about yourself  0    Trouble concentrating 1    Moving slowly or fidgety/restless 0    Suicidal thoughts 0    PHQ-9 Score 9    Difficult doing work/chores Somewhat difficult      History Scott James has a past medical history of Allergy, Arthritis, Bulging disc, GERD (gastroesophageal reflux disease), and Hypertension.   He has a past surgical history that includes tonsillectomy; Joint  replacement; Ankle fracture surgery (Right, 1988); Hand surgery; Cholecystectomy; Knee arthroscopy (Left, 2018); and Shoulder surgery (Right).   His family history includes Hypertension in his mother; Non-Hodgkin's lymphoma in his father; Prostate cancer in his brother.He reports that he quit smoking about 16 years ago. His smoking use included cigarettes. He has never used smokeless tobacco. He reports current alcohol use. He reports that he does not use drugs.    ROS Review of Systems  Objective:  BP 139/82   Pulse 82   Temp 97.8 F (36.6 C)   Wt 194 lb (88 kg)   SpO2 94%   BMI 27.84 kg/m   BP Readings from Last 3 Encounters:  11/09/23 139/82  05/12/23 126/75  12/30/21 (!) 145/90    Wt Readings from Last 3 Encounters:  11/09/23 194 lb (88 kg)  05/12/23 180 lb (81.6 kg)  04/21/23 175 lb (79.4 kg)     Physical Exam Physical Exam GENERAL: Alert, cooperative, well developed, no acute distress. HEENT: Normocephalic, normal oropharynx, moist mucous membranes, throat red with drainage, sinus pressure in frontal region. CHEST: Clear to auscultation bilaterally, no wheezes, rhonchi, or crackles. CARDIOVASCULAR: Normal heart rate and rhythm, S1 and S2 normal without murmurs. ABDOMEN: Soft, non-tender, non-distended, without organomegaly, normal bowel sounds. EXTREMITIES: No cyanosis or edema. NEUROLOGICAL: Cranial  nerves grossly intact, moves all extremities without gross motor or sensory deficit.   Assessment & Plan:  Sore throat -     Veritor SARS-CoV-2 and Flu A+B  Other orders -     Amoxicillin -Pot Clavulanate; Take 1 tablet by mouth 2 (two) times daily. Take all of this medication  Dispense: 20 tablet; Refill: 0 -     predniSONE ; Take 5 daily for 2 days followed by 4,3,2 and 1 for 2 days each.  Dispense: 30 tablet; Refill: 0 -     Pseudoephedrine-guaiFENesin ER; Take 1 tablet by mouth 2 (two) times daily. For congestion  Dispense: 14 tablet; Refill:  PRN    Assessment and Plan Assessment & Plan Acute sinusitis and acute pharyngitis   He presents with acute sinusitis characterized by frontal sinus pressure, headache, and clear nasal discharge, along with acute pharyngitis marked by a sore throat and drainage. He experiences chills but no fever or earaches. Lungs are clear on auscultation. Symptoms and history suggest likely bacterial sinusitis. Prescribed antibiotics and a decongestant.       Follow-up: Return if symptoms worsen or fail to improve.  Butler Der, M.D.

## 2023-11-09 NOTE — Telephone Encounter (Signed)
 FYI Only or Action Required?: FYI only for provider: appointment scheduled on today.  Patient was last seen in primary care on 12/30/2021 by Gladis Mustard, FNP.  Called Nurse Triage reporting Sore Throat.  Symptoms began several days ago.  Symptoms are: gradually worsening.  Triage Disposition: See Physician Within 24 Hours  Patient/caregiver understands and will follow disposition?: Yes      Copied from CRM [id number]. Topic: Clinical - Red Word Triage >> Nov 09, 2023 10:27 AM Gustabo D wrote: Sore throat and head fills like it's about explode, his sinus is inflammed started Monday.       Reason for Disposition  SEVERE throat pain (e.g., excruciating)  Answer Assessment - Initial Assessment Questions 1. ONSET: When did the throat start hurting? (Hours or days ago)      2 days ago 2. SEVERITY: How bad is the sore throat? (Scale 1-10; mild, moderate or severe)     Moderate to severe 3. STREP EXPOSURE: Has there been any exposure to strep within the past week? If Yes, ask: What type of contact occurred?      No 4.  VIRAL SYMPTOMS: Are there any symptoms of a cold, such as a runny nose, cough, hoarse voice or red eyes?      Runny nose 5. FEVER: Do you have a fever? If Yes, ask: What is your temperature, how was it measured, and when did it start?     Has not checked  6. PUS ON THE TONSILS: Is there pus on the tonsils in the back of your throat?     No 7. OTHER SYMPTOMS: Do you have any other symptoms? (e.g., difficulty breathing, headache, rash)     Frontal headache, chills  Protocols used: Sore Throat-A-AH

## 2023-11-09 NOTE — Telephone Encounter (Signed)
 Pt has appt
